# Patient Record
Sex: Female | Born: 1950 | Race: White | Hispanic: No | Smoking: Never smoker
Health system: Southern US, Community
[De-identification: ages and names within clinical notes are randomized; demographics above are authoritative.]

## PROBLEM LIST (undated history)

## (undated) DIAGNOSIS — J3081 Allergic rhinitis due to animal (cat) (dog) hair and dander: Secondary | ICD-10-CM

## (undated) DIAGNOSIS — T7840XA Allergy, unspecified, initial encounter: Secondary | ICD-10-CM

## (undated) DIAGNOSIS — L509 Urticaria, unspecified: Secondary | ICD-10-CM

## (undated) DIAGNOSIS — D229 Melanocytic nevi, unspecified: Secondary | ICD-10-CM

## (undated) DIAGNOSIS — I471 Supraventricular tachycardia, unspecified: Secondary | ICD-10-CM

## (undated) DIAGNOSIS — E7800 Pure hypercholesterolemia, unspecified: Secondary | ICD-10-CM

## (undated) DIAGNOSIS — M542 Cervicalgia: Secondary | ICD-10-CM

## (undated) DIAGNOSIS — R0789 Other chest pain: Secondary | ICD-10-CM

## (undated) DIAGNOSIS — E039 Hypothyroidism, unspecified: Secondary | ICD-10-CM

## (undated) HISTORY — PX: OTHER SURGICAL HISTORY: SHX169

## (undated) HISTORY — PX: ABDOMINAL HYSTERECTOMY: SHX81

## (undated) HISTORY — DX: Allergy, unspecified, initial encounter: T78.40XA

## (undated) HISTORY — PX: CARPAL TUNNEL RELEASE: SHX101

## (undated) HISTORY — DX: Melanocytic nevi, unspecified: D22.9

---

## 2005-06-01 ENCOUNTER — Ambulatory Visit: Payer: Self-pay

## 2006-02-09 ENCOUNTER — Ambulatory Visit: Payer: Self-pay | Admitting: Gastroenterology

## 2006-06-08 ENCOUNTER — Ambulatory Visit: Payer: Self-pay

## 2007-03-28 ENCOUNTER — Ambulatory Visit: Payer: Self-pay | Admitting: Urology

## 2007-05-09 ENCOUNTER — Ambulatory Visit: Payer: Self-pay | Admitting: Internal Medicine

## 2007-06-25 ENCOUNTER — Ambulatory Visit: Payer: Self-pay

## 2007-07-10 ENCOUNTER — Ambulatory Visit: Payer: Self-pay | Admitting: Urology

## 2008-07-06 ENCOUNTER — Ambulatory Visit: Payer: Self-pay

## 2009-07-27 ENCOUNTER — Ambulatory Visit: Payer: Self-pay

## 2010-07-28 ENCOUNTER — Ambulatory Visit: Payer: Self-pay

## 2011-07-31 ENCOUNTER — Ambulatory Visit: Payer: Self-pay

## 2012-06-13 ENCOUNTER — Ambulatory Visit: Payer: Self-pay | Admitting: General Practice

## 2013-09-28 ENCOUNTER — Emergency Department: Payer: Self-pay | Admitting: Internal Medicine

## 2016-04-07 ENCOUNTER — Encounter: Payer: Self-pay | Admitting: *Deleted

## 2016-04-10 ENCOUNTER — Encounter
Admission: RE | Disposition: A | Discharge status: Home / Self Care | Payer: Self-pay | Source: Ambulatory Visit | Attending: Gastroenterology

## 2016-04-10 ENCOUNTER — Ambulatory Visit: Payer: Medicare PPO | Admitting: Anesthesiology

## 2016-04-10 ENCOUNTER — Ambulatory Visit
Admission: RE | Admit: 2016-04-10 | Discharge: 2016-04-10 | Disposition: A | Discharge status: Home / Self Care | Payer: Medicare PPO | Source: Ambulatory Visit | Attending: Gastroenterology | Admitting: Gastroenterology

## 2016-04-10 ENCOUNTER — Encounter: Payer: Self-pay | Admitting: Gastroenterology

## 2016-04-10 DIAGNOSIS — E7800 Pure hypercholesterolemia, unspecified: Secondary | ICD-10-CM | POA: Diagnosis not present

## 2016-04-10 DIAGNOSIS — Z9889 Other specified postprocedural states: Secondary | ICD-10-CM | POA: Insufficient documentation

## 2016-04-10 DIAGNOSIS — K644 Residual hemorrhoidal skin tags: Secondary | ICD-10-CM | POA: Insufficient documentation

## 2016-04-10 DIAGNOSIS — K640 First degree hemorrhoids: Secondary | ICD-10-CM | POA: Insufficient documentation

## 2016-04-10 DIAGNOSIS — Z79899 Other long term (current) drug therapy: Secondary | ICD-10-CM | POA: Insufficient documentation

## 2016-04-10 DIAGNOSIS — E039 Hypothyroidism, unspecified: Secondary | ICD-10-CM | POA: Diagnosis not present

## 2016-04-10 DIAGNOSIS — Z7982 Long term (current) use of aspirin: Secondary | ICD-10-CM | POA: Diagnosis not present

## 2016-04-10 DIAGNOSIS — J3081 Allergic rhinitis due to animal (cat) (dog) hair and dander: Secondary | ICD-10-CM | POA: Insufficient documentation

## 2016-04-10 DIAGNOSIS — Z90710 Acquired absence of both cervix and uterus: Secondary | ICD-10-CM | POA: Insufficient documentation

## 2016-04-10 DIAGNOSIS — Z1211 Encounter for screening for malignant neoplasm of colon: Secondary | ICD-10-CM | POA: Diagnosis not present

## 2016-04-10 DIAGNOSIS — I471 Supraventricular tachycardia: Secondary | ICD-10-CM | POA: Insufficient documentation

## 2016-04-10 DIAGNOSIS — Z791 Long term (current) use of non-steroidal anti-inflammatories (NSAID): Secondary | ICD-10-CM | POA: Insufficient documentation

## 2016-04-10 HISTORY — PX: COLONOSCOPY WITH PROPOFOL: SHX5780

## 2016-04-10 HISTORY — DX: Urticaria, unspecified: L50.9

## 2016-04-10 HISTORY — DX: Allergic rhinitis due to animal (cat) (dog) hair and dander: J30.81

## 2016-04-10 HISTORY — DX: Supraventricular tachycardia, unspecified: I47.10

## 2016-04-10 HISTORY — DX: Supraventricular tachycardia: I47.1

## 2016-04-10 HISTORY — DX: Hypothyroidism, unspecified: E03.9

## 2016-04-10 HISTORY — DX: Other chest pain: R07.89

## 2016-04-10 HISTORY — DX: Cervicalgia: M54.2

## 2016-04-10 HISTORY — DX: Pure hypercholesterolemia, unspecified: E78.00

## 2016-04-10 SURGERY — COLONOSCOPY WITH PROPOFOL
Anesthesia: General

## 2016-04-10 MED ORDER — FENTANYL CITRATE (PF) 100 MCG/2ML IJ SOLN
INTRAMUSCULAR | Status: DC | PRN
  Administered 2016-04-10: 50 ug via INTRAVENOUS

## 2016-04-10 MED ORDER — SODIUM CHLORIDE 0.9 % IV SOLN
INTRAVENOUS | Status: DC
  Administered 2016-04-10 (×2): via INTRAVENOUS

## 2016-04-10 MED ORDER — MIDAZOLAM HCL 2 MG/2ML IJ SOLN
INTRAMUSCULAR | Status: DC | PRN
  Administered 2016-04-10: 1 mg via INTRAVENOUS

## 2016-04-10 MED ORDER — PROPOFOL 500 MG/50ML IV EMUL
INTRAVENOUS | Status: DC | PRN
  Administered 2016-04-10: 80 ug/kg/min via INTRAVENOUS

## 2016-04-10 MED ORDER — PROPOFOL 10 MG/ML IV BOLUS
INTRAVENOUS | Status: DC | PRN
  Administered 2016-04-10: 20 mg via INTRAVENOUS

## 2016-04-10 NOTE — Op Note (Signed)
[facility] Patient Name: Kayla Jensen Procedure Date: 04/10/2016 1:08 PM MRN: [id number] Account #: [id number] Date of Birth: 1951-07-12 Admit Type: Outpatient Age: 65 Room: [facility] ENDO ROOM 2 Gender: Female Note Status: Finalized Procedure:            Colonoscopy Indications:          Screening for colorectal malignant neoplasm, Last                        colonoscopy 10 years ago Patient Profile:      This is a 65 year old female. Providers:            Gerrit Heck. Rayann Heman, MD Referring MD:         Leonie Douglas. Doy Hutching, MD (Referring MD) Medicines:            Propofol per Anesthesia Complications:        No immediate complications. Procedure:            Pre-Anesthesia Assessment:                       - Prior to the procedure, a History and Physical was                        performed, and patient medications, allergies and                        sensitivities were reviewed. The patient's tolerance of                        previous anesthesia was reviewed.                       After obtaining informed consent, the colonoscope was                        passed under direct vision. Throughout the procedure,                        the patient's blood pressure, pulse, and oxygen                        saturations were monitored continuously. The                        Colonoscope was introduced through the anus and                        advanced to the the cecum, identified by appendiceal                        orifice and ileocecal valve. The colonoscopy was                        performed without difficulty. The patient tolerated the                        procedure well. The quality of the bowel preparation                        was excellent. Findings:      The perianal  exam findings include non-thrombosed external hemorrhoids.      Internal hemorrhoids were found during retroflexion. The hemorrhoids       were Grade I (internal hemorrhoids that do  not prolapse).      The exam was otherwise without abnormality. Impression:           - Non-thrombosed external hemorrhoids found on perianal                        exam.                       - Internal hemorrhoids.                       - The examination was otherwise normal.                       - No specimens collected. Recommendation:       - Observe patient in GI recovery unit.                       - High fiber diet.                       - Continue present medications.                       - Repeat colonoscopy in 10 years for screening purposes.                       - Return to referring physician.                       - Return to referring physician.                       - The findings and recommendations were discussed with                        the patient.                       - The findings and recommendations were discussed with                        the patient's family. Procedure Code(s):    --- Professional ---                       [phone], Colonoscopy, flexible; diagnostic, including                        collection of specimen(s) by brushing or washing, when                        performed (separate procedure) Diagnosis Code(s):    --- Professional ---                       Z12.11, Encounter for screening for malignant neoplasm                        of colon  K64.0, First degree hemorrhoids                       K64.4, Residual hemorrhoidal skin tags CPT copyright 2016 American Medical Association. All rights reserved. The codes documented in this report are preliminary and upon coder review may  be revised to meet current compliance requirements. Mellody Life, MD 04/10/2016 1:30:48 PM This report has been signed electronically. Number of Addenda: 0 Note Initiated On: 04/10/2016 1:08 PM Scope Withdrawal Time: 0 hours 12 minutes 16 seconds  Total Procedure Duration: 0 hours 17 minutes 28 seconds       [facility]

## 2016-04-10 NOTE — Anesthesia Preprocedure Evaluation (Signed)
Anesthesia Evaluation  Patient identified by MRN, date of birth, ID band Patient awake    Reviewed: Allergy & Precautions, H&P , NPO status , Patient's Chart, lab work & pertinent test results, reviewed documented beta blocker date and time   Airway Mallampati: II   Neck ROM: full    Dental  (+) Poor Dentition   Pulmonary neg pulmonary ROS,    Pulmonary exam normal        Cardiovascular negative cardio ROS Normal cardiovascular exam     Neuro/Psych negative neurological ROS  negative psych ROS   GI/Hepatic negative GI ROS, Neg liver ROS,   Endo/Other  negative endocrine ROSHypothyroidism   Renal/GU negative Renal ROS  negative genitourinary   Musculoskeletal   Abdominal   Peds  Hematology negative hematology ROS (+)   Anesthesia Other Findings Past Medical History:   Hypothyroidism                                               Cat allergies                                                Hives                                                        High cholesterol                                             Non-cardiac chest pain                                       PSVT (paroxysmal supraventricular tachycardia)*              Cervicalgia                                                Past Surgical History:   ABDOMINAL HYSTERECTOMY                                        Left foot surgery                                             CARPAL TUNNEL RELEASE                                         right hand ganglion removal  BMI    Body Mass Index   21.97 kg/m 2     Reproductive/Obstetrics                             Anesthesia Physical Anesthesia Plan  ASA: III  Anesthesia Plan: General   Post-op Pain Management:    Induction:   Airway Management Planned:   Additional Equipment:   Intra-op Plan:   Post-operative Plan:   Informed Consent: I  have reviewed the patients History and Physical, chart, labs and discussed the procedure including the risks, benefits and alternatives for the proposed anesthesia with the patient or authorized representative who has indicated his/her understanding and acceptance.   Dental Advisory Given  Plan Discussed with: CRNA  Anesthesia Plan Comments:         Anesthesia Quick Evaluation

## 2016-04-10 NOTE — Transfer of Care (Signed)
Immediate Anesthesia Transfer of Care Note  Patient: Kayla Jensen  Procedure(s) Performed: Procedure(s): COLONOSCOPY WITH PROPOFOL (N/A)  Patient Location: PACU  Anesthesia Type:General  Level of Consciousness: awake, alert  and oriented  Airway & Oxygen Therapy: Patient Spontanous Breathing and Patient connected to nasal cannula oxygen  Post-op Assessment: Report given to RN and Post -op Vital signs reviewed and stable  Post vital signs: Reviewed and stable  Last Vitals:  Filed Vitals:   04/10/16 1236  BP: 136/63  Pulse: 76  Temp: 36.7 C  Resp: 17    Complications: No apparent anesthesia complications

## 2016-04-10 NOTE — Discharge Instructions (Signed)

## 2016-04-10 NOTE — Anesthesia Postprocedure Evaluation (Signed)
Anesthesia Post Note  Patient: Kayla Jensen  Procedure(s) Performed: Procedure(s) (LRB): COLONOSCOPY WITH PROPOFOL (N/A)  Patient location during evaluation: PACU Anesthesia Type: General Level of consciousness: awake and alert Pain management: pain level controlled Vital Signs Assessment: post-procedure vital signs reviewed and stable Respiratory status: spontaneous breathing, nonlabored ventilation, respiratory function stable and patient connected to nasal cannula oxygen Cardiovascular status: blood pressure returned to baseline and stable Postop Assessment: no signs of nausea or vomiting Anesthetic complications: no    Last Vitals:  Filed Vitals:   04/10/16 1236 04/10/16 1332  BP: 136/63 106/56  Pulse: 76 104  Temp: 36.7 C 35.9 C  Resp: 17 19    Last Pain: There were no vitals filed for this visit.               Molli Barrows

## 2016-04-10 NOTE — H&P (Signed)
  Primary Care Physician:  Idelle Crouch, MD  Pre-Procedure History & Physical: HPI:  Kayla Jensen is a 65 y.o. female is here for an colonoscopy.   Past Medical History  Diagnosis Date  . Hypothyroidism   . Cat allergies   . Hives   . High cholesterol   . Non-cardiac chest pain   . PSVT (paroxysmal supraventricular tachycardia) ([place])   . Cervicalgia     Past Surgical History  Procedure Laterality Date  . Abdominal hysterectomy    . Left foot surgery    . Carpal tunnel release    . Right hand ganglion removal      Prior to Admission medications   Medication Sig Start Date End Date Taking? Authorizing Provider  aspirin EC 81 MG tablet Take 81 mg by mouth daily.   Yes Historical Provider, MD  Biotin 2500 MCG CAPS Take 2 capsules by mouth every morning.   Yes Historical Provider, MD  cetirizine (ZYRTEC) 10 MG tablet Take 10 mg by mouth at bedtime.   Yes Historical Provider, MD  estrogens, conjugated, (PREMARIN) 1.25 MG tablet Take 1.25 mg by mouth daily.   Yes Historical Provider, MD  ibuprofen (ADVIL,MOTRIN) 200 MG tablet Take 800 mg by mouth every 8 (eight) hours as needed.   Yes Historical Provider, MD  magnesium oxide (MAG-OX) 400 MG tablet Take 400 mg by mouth 2 (two) times daily.   Yes Historical Provider, MD  niacin (SLO-NIACIN) 500 MG tablet Take 500 mg by mouth at bedtime.   Yes Historical Provider, MD  vitamin E 400 UNIT capsule Take 400 Units by mouth daily.   Yes Historical Provider, MD    Allergies as of 04/04/2016  . (Not on File)    No family history on file.  Social History   Social History  . Marital Status: Married    Spouse Name: N/A  . Number of Children: N/A  . Years of Education: N/A   Occupational History  . Not on file.   Social History Main Topics  . Smoking status: Not on file  . Smokeless tobacco: Not on file  . Alcohol Use: Not on file  . Drug Use: Not on file  . Sexual Activity: Not on file   Other Topics Concern  . Not on file    Social History Narrative  . No narrative on file     Physical Exam: BP 136/63 mmHg  Pulse 76  Temp(Src) 98.1 F (36.7 C) (Oral)  Resp 17  Ht 5\' 3"  (1.6 m)  Wt 56.246 kg (124 lb)  BMI 21.97 kg/m2  SpO2 100% General:   Alert,  pleasant and cooperative in NAD Head:  Normocephalic and atraumatic. Neck:  Supple; no masses or thyromegaly. Lungs:  Clear throughout to auscultation.    Heart:  Regular rate and rhythm. Abdomen:  Soft, nontender and nondistended. Normal bowel sounds, without guarding, and without rebound.   Neurologic:  Alert and  oriented x4;  grossly normal neurologically.  Impression/Plan: Kayla Jensen is here for an colonoscopy to be performed for screening  Risks, benefits, limitations, and alternatives regarding  colonoscopy have been reviewed with the patient.  Questions have been answered.  All parties agreeable.   Josefine Class, MD  04/10/2016, 12:47 PM

## 2016-06-23 ENCOUNTER — Other Ambulatory Visit: Payer: Self-pay | Admitting: Internal Medicine

## 2016-06-23 DIAGNOSIS — Z1231 Encounter for screening mammogram for malignant neoplasm of breast: Secondary | ICD-10-CM

## 2016-06-28 ENCOUNTER — Other Ambulatory Visit: Payer: Self-pay | Admitting: *Deleted

## 2016-06-28 ENCOUNTER — Inpatient Hospital Stay
Admission: RE | Admit: 2016-06-28 | Discharge: 2016-06-28 | Disposition: A | Discharge status: Home / Self Care | Payer: Self-pay | Source: Ambulatory Visit | Attending: *Deleted | Admitting: *Deleted

## 2016-06-28 DIAGNOSIS — Z9289 Personal history of other medical treatment: Secondary | ICD-10-CM

## 2016-08-14 ENCOUNTER — Ambulatory Visit
Admission: RE | Admit: 2016-08-14 | Discharge: 2016-08-14 | Disposition: A | Discharge status: Home / Self Care | Payer: Medicare PPO | Source: Ambulatory Visit | Attending: Internal Medicine | Admitting: Internal Medicine

## 2016-08-14 ENCOUNTER — Other Ambulatory Visit: Payer: Self-pay | Admitting: Internal Medicine

## 2016-08-14 DIAGNOSIS — Z1231 Encounter for screening mammogram for malignant neoplasm of breast: Secondary | ICD-10-CM | POA: Diagnosis present

## 2017-07-09 ENCOUNTER — Other Ambulatory Visit: Payer: Self-pay | Admitting: Internal Medicine

## 2017-07-09 DIAGNOSIS — Z1231 Encounter for screening mammogram for malignant neoplasm of breast: Secondary | ICD-10-CM

## 2017-08-15 ENCOUNTER — Ambulatory Visit
Admission: RE | Admit: 2017-08-15 | Discharge: 2017-08-15 | Disposition: A | Discharge status: Home / Self Care | Payer: Medicare PPO | Source: Ambulatory Visit | Attending: Internal Medicine | Admitting: Internal Medicine

## 2017-08-15 DIAGNOSIS — Z1231 Encounter for screening mammogram for malignant neoplasm of breast: Secondary | ICD-10-CM | POA: Insufficient documentation

## 2017-09-03 ENCOUNTER — Ambulatory Visit (INDEPENDENT_AMBULATORY_CARE_PROVIDER_SITE_OTHER): Payer: Medicare PPO | Admitting: Obstetrics & Gynecology

## 2017-09-03 ENCOUNTER — Encounter: Payer: Self-pay | Admitting: Obstetrics & Gynecology

## 2017-09-03 VITALS — BP 100/60 | HR 86 | Ht 63.0 in | Wt 134.0 lb

## 2017-09-03 DIAGNOSIS — Z780 Asymptomatic menopausal state: Secondary | ICD-10-CM

## 2017-09-03 MED ORDER — ESTROGENS CONJUGATED 1.25 MG PO TABS: 1.2500 mg | ORAL_TABLET | Freq: Every day | ORAL | 3 refills | Status: DC

## 2017-09-03 NOTE — Patient Instructions (Signed)
Conjugated Estrogens tablets What is this medicine? CONJUGATED ESTROGENS (CON ju gate ed ESS troe jenz) is an estrogen. It is used as hormone replacement in menopausal women. It helps to treat hot flashes and prevent osteoporosis. It is also used to treat women with low hormone levels or in those who have had their ovaries removed. This medicine may be used for other purposes; ask your health care provider or pharmacist if you have questions. COMMON BRAND NAME(S): Premarin What should I tell my health care provider before I take this medicine? They need to know if you have any of these conditions: -abnormal vaginal bleeding -blood vessel disease or blood clots -breast, cervical, endometrial, ovarian, liver, or uterine cancer -dementia -diabetes -endometriosis -fibroids -gallbladder disease -heart disease or recent heart attack -high blood pressure -high cholesterol -high level of calcium in the blood -kidney disease -liver disease -mental depression -migraine headaches -protein C deficiency -protein S deficiency -stroke -tobacco smoker -an unusual or allergic reaction to estrogens, other medicines, foods, dyes, or preservatives -pregnant or trying to get pregnant -breast-feeding How should I use this medicine? Take this medicine by mouth with a glass of water. Follow the directions on the prescription label. Take your medicine at regular intervals, at the same time each day. Do not take your medicine more often than directed. A patient package insert for the product will be given with each prescription and refill. Read this sheet carefully each time. Talk to your pediatrician regarding the use of this medicine in children. This medicine is not approved for use in children. Overdosage: If you think you have taken too much of this medicine contact a poison control center or emergency room at once. NOTE: This medicine is only for you. Do not share this medicine with others. What if I  miss a dose? If you miss a dose, take it as soon as you can. If it is almost time for your next dose, take only that dose. Do not take double or extra doses. What may interact with this medicine? Do not take this medicine with any of the following medications: -aromatase inhibitors like aminoglutethimide, anastrozole, exemestane, letrozole, testolactone -metyrapone This medicine may also interact with the following medications: -barbiturates, such as phenobarbital -carbamazepine -clarithromycin -erythromycin -grapefruit juice -medicines for fungal infections like ketoconazole and itraconazole -phenytoin - rifampin -ritonavir -St. John's Wort -thyroid hormones This list may not describe all possible interactions. Give your health care provider a list of all the medicines, herbs, non-prescription drugs, or dietary supplements you use. Also tell them if you smoke, drink alcohol, or use illegal drugs. Some items may interact with your medicine. What should I watch for while using this medicine? Visit your health care professional for regular checks on your progress. You will need a regular breast and pelvic exam and Pap smear while on this medicine. You should also discuss the need for regular mammograms with your health care professional, and follow his or her guidelines for these tests. This medicine can make your body retain fluid, making your fingers, hands, or ankles swell. Your blood pressure can go up. Contact your doctor or health care professional if you feel you are retaining fluid. If you have any reason to think you are pregnant; stop taking this medicine at once and contact your doctor or health care professional. Smoking increases the risk of getting a blood clot or having a stroke while you are taking this medicine, especially if you are more than 66 years old. You are strongly advised  not to smoke. If you wear contact lenses and notice visual changes, or if the lenses begin to  feel uncomfortable, consult your eye care specialist. The tablet shell for some brands of this medicine does not dissolve. The tablet shell may appear whole in the stool. This is not a cause for concern. If you see something that resembles a tablet in your stool, talk to your healthcare provider. This medicine can increase the risk of developing a condition (endometrial hyperplasia) that may lead to cancer of the lining of the uterus. Taking progestins, another hormone drug, with this medicine lowers the risk of developing this condition. Therefore, if your uterus has not been removed (by a hysterectomy), your doctor may prescribe a progestin for you to take together with your estrogen. You should know, however, that taking estrogens with progestins may have additional health risks. You should discuss the use of estrogens and progestins with your health care professional to determine the benefits and risks for you. If you are going to have surgery, you may need to stop taking this medicine. Consult your health care professional for advice before you schedule the surgery. What side effects may I notice from receiving this medicine? Side effects that you should report to your doctor or health care professional as soon as possible: -allergic reactions like skin rash, itching or hives, swelling of the face, lips, or tongue -breast tissue changes or discharge -changes in vision -chest pain -confusion, trouble speaking or understanding -dark urine -general ill feeling or flu-like symptoms -light-colored stools -nausea, vomiting -pain, swelling, warmth in the leg -right upper belly pain -severe headaches -shortness of breath -sudden numbness or weakness of the face, arm or leg -trouble walking, dizziness, loss of balance or coordination -unusual vaginal bleeding -yellowing of the eyes or skin Side effects that usually do not require medical attention (report to your doctor or health care professional  if they continue or are bothersome): -hair loss -increased hunger or thirst -increased urination -symptoms of vaginal infection like itching, irritation or unusual discharge -unusually weak or tired This list may not describe all possible side effects. Call your doctor for medical advice about side effects. You may report side effects to FDA at 1-800-FDA-1088. Where should I keep my medicine? Keep out of the reach of children. Store at room temperature between 15 and 30 degrees C (59 and 86 degrees F). Throw away any unused medicine after the expiration date. NOTE: This sheet is a summary. It may not cover all possible information. If you have questions about this medicine, talk to your doctor, pharmacist, or health care provider.  2018 Elsevier/Gold Standard (2011-03-15 09:20:56)

## 2017-09-03 NOTE — Addendum Note (Signed)
Addended by: Gae Dry on: 09/03/2017 01:52 PM   Modules accepted: Orders

## 2017-09-03 NOTE — Progress Notes (Signed)
History of Present Illness:  Kayla Jensen is a 66 y.o. who was started on Premarin years ago but started on a taper down in dose last year from 1.25 mg to 0.9 mg daily therapy; Since that time, she states that her symptoms are worsening.  She went back to her 1.25 mg daily dose. Denies bladder or vaginal sx's. Prior TAH BSO  PMHx: She  has a past medical history of Cat allergies; Cervicalgia; High cholesterol; Hives; Hypothyroidism; Non-cardiac chest pain; and PSVT (paroxysmal supraventricular tachycardia) ([place]). Also,  has a past surgical history that includes Abdominal hysterectomy; Left foot surgery; Carpal tunnel release; right hand ganglion removal; and Colonoscopy with propofol (N/A, 04/10/2016)., family history includes Breast cancer in her cousin and maternal aunt.,  reports that she has never smoked. She has never used smokeless tobacco. She reports that she does not drink alcohol or use drugs.  Current Outpatient Prescriptions:  .  aspirin EC 81 MG tablet, Take 81 mg by mouth daily., Disp: , Rfl:  .  Biotin 2500 MCG CAPS, Take 2 capsules by mouth every morning., Disp: , Rfl:  .  cetirizine (ZYRTEC) 10 MG tablet, Take 10 mg by mouth at bedtime., Disp: , Rfl:  .  estrogens, conjugated, (PREMARIN) 1.25 MG tablet, Take 1.25 mg by mouth daily., Disp: , Rfl:  .  ibuprofen (ADVIL,MOTRIN) 200 MG tablet, Take 800 mg by mouth every 8 (eight) hours as needed., Disp: , Rfl:  .  magnesium oxide (MAG-OX) 400 MG tablet, Take 400 mg by mouth 2 (two) times daily., Disp: , Rfl:  .  niacin (SLO-NIACIN) 500 MG tablet, Take 500 mg by mouth at bedtime., Disp: , Rfl:  .  vitamin E 400 UNIT capsule, Take 400 Units by mouth daily., Disp: , Rfl:   Also, is allergic to fosteum [genistein-zn chelate-vit d] and hydrocodone-homatropine..  Review of Systems  Constitutional: Negative for chills, fever and malaise/fatigue.  HENT: Negative for congestion, sinus pain and sore throat.   Eyes: Negative for blurred  vision and pain.  Respiratory: Negative for cough and wheezing.   Cardiovascular: Negative for chest pain and leg swelling.  Gastrointestinal: Negative for abdominal pain, constipation, diarrhea, heartburn, nausea and vomiting.  Genitourinary: Negative for dysuria, frequency, hematuria and urgency.  Musculoskeletal: Negative for back pain, joint pain, myalgias and neck pain.  Skin: Negative for itching and rash.  Neurological: Negative for dizziness, tremors and weakness.  Endo/Heme/Allergies: Does not bruise/bleed easily.  Psychiatric/Behavioral: Negative for depression. The patient is not nervous/anxious and does not have insomnia.     Physical Exam:  BP 100/60   Pulse 86   Ht 5\' 3"  (1.6 m)   Wt 134 lb (60.8 kg)   BMI 23.74 kg/m  Body mass index is 23.74 kg/m. Physical examination Constitutional NAD, Conversant  HEENT Atraumatic; Op clear with mmm.  Normo-cephalic. Pupils reactive. Anicteric sclerae  Thyroid/Neck Smooth without nodularity or enlargement. Normal ROM.  Neck Supple.  Skin No rashes, lesions or ulceration. Normal palpated skin turgor. No nodularity.  Breasts: No masses or discharge.  Symmetric.  No axillary adenopathy.  Lungs: Clear to auscultation.No rales or wheezes. Normal Respiratory effort, no retractions.  Heart: NSR.  No murmurs or rubs appreciated. No periferal edema  Abdomen: Soft.  Non-tender.  No masses.  No HSM. No hernia  Extremities: Moves all appropriately.  Normal ROM for age. No lymphadenopathy.  Neuro: Grossly intact  Psych: Oriented to PPT.  Normal mood. Normal affect.     Pelvic:  Vulva: Normal appearance.  No lesions.  Vagina: No lesions or abnormalities noted.  Support: Normal pelvic support.  Urethra No masses tenderness or scarring.  Meatus Normal size without lesions or prolapse.  Cervix Surgically absent  Vag Cuff: Intact.  No lesions.  Anus: Normal exam.  No lesions.  Perineum: Normal exam.  No lesions.        Bimanual   Uterus:  Surgically Absent  Adnexae: No masses.  Non-tender to palpation.  Cuff: Negative for abnormality.   Assessment:  Problem List Items Addressed This Visit      Other   Menopause - Primary     Medication treatment is going well for her menopause sx's, and did not tolerate taper well.   Plan: She will undergo no change in her medical therapy.  Offered taper vs alternative non-hormonal therapies, she prefers current therapy.  She was amenable to this plan and we will see her back for annual/PRN. Needs PAP next year. Colonoscopy 2016, due in 8 years.  Stool testing at PCP. MMG UTD.  Barnett Applebaum, MD, [facility], [facility] 09/03/2017  1:35 PM

## 2018-07-08 ENCOUNTER — Other Ambulatory Visit: Payer: Self-pay | Admitting: Internal Medicine

## 2018-07-08 DIAGNOSIS — Z1231 Encounter for screening mammogram for malignant neoplasm of breast: Secondary | ICD-10-CM

## 2018-08-20 ENCOUNTER — Ambulatory Visit
Admission: RE | Admit: 2018-08-20 | Discharge: 2018-08-20 | Disposition: A | Discharge status: Home / Self Care | Payer: Medicare PPO | Source: Ambulatory Visit | Attending: Internal Medicine | Admitting: Internal Medicine

## 2018-08-20 DIAGNOSIS — Z1231 Encounter for screening mammogram for malignant neoplasm of breast: Secondary | ICD-10-CM | POA: Insufficient documentation

## 2018-09-09 ENCOUNTER — Encounter: Payer: Self-pay | Admitting: Obstetrics & Gynecology

## 2018-09-09 ENCOUNTER — Other Ambulatory Visit (HOSPITAL_COMMUNITY)
Admission: RE | Admit: 2018-09-09 | Discharge: 2018-09-09 | Disposition: A | Discharge status: Home / Self Care | Payer: Medicare PPO | Source: Ambulatory Visit | Attending: Obstetrics & Gynecology | Admitting: Obstetrics & Gynecology

## 2018-09-09 ENCOUNTER — Ambulatory Visit (INDEPENDENT_AMBULATORY_CARE_PROVIDER_SITE_OTHER): Payer: Medicare PPO | Admitting: Obstetrics & Gynecology

## 2018-09-09 VITALS — BP 130/80 | Ht 63.0 in | Wt 134.0 lb

## 2018-09-09 DIAGNOSIS — Z124 Encounter for screening for malignant neoplasm of cervix: Secondary | ICD-10-CM | POA: Insufficient documentation

## 2018-09-09 DIAGNOSIS — Z01419 Encounter for gynecological examination (general) (routine) without abnormal findings: Secondary | ICD-10-CM | POA: Diagnosis not present

## 2018-09-09 DIAGNOSIS — Z0000 Encounter for general adult medical examination without abnormal findings: Secondary | ICD-10-CM

## 2018-09-09 DIAGNOSIS — Z1272 Encounter for screening for malignant neoplasm of vagina: Secondary | ICD-10-CM | POA: Diagnosis not present

## 2018-09-09 DIAGNOSIS — Z780 Asymptomatic menopausal state: Secondary | ICD-10-CM

## 2018-09-09 MED ORDER — ESTROGENS CONJUGATED 1.25 MG PO TABS: 1.2500 mg | ORAL_TABLET | Freq: Every day | ORAL | 3 refills | Status: DC

## 2018-09-09 NOTE — Progress Notes (Signed)
HPI:      [name] is a 67 y.o. G1P1001 who LMP was in the past, she presents today for her annual examination.  The patient has no complaints today.  Herlast pap: approximate date 2014 and was normal and last mammogram: approximate date 2019 (august) and was normal.  The patient does perform self breast exams.  There is no notable family history of breast or ovarian cancer in her family. The patient is taking hormone replacement therapy. Patient denies post-menopausal vaginal bleeding.   The patient has regular exercise: yes. The patient denies current symptoms of depression.    GYN Hx: Last Colonoscopy:3 years ago. Normal.  Last DEXA: never ago.    PMHx: Past Medical History:  Diagnosis Date  . Cat allergies   . Cervicalgia   . High cholesterol   . Hives   . Hypothyroidism   . Non-cardiac chest pain   . PSVT (paroxysmal supraventricular tachycardia) ([place])    Past Surgical History:  Procedure Laterality Date  . ABDOMINAL HYSTERECTOMY    . CARPAL TUNNEL RELEASE    . COLONOSCOPY WITH PROPOFOL N/A 04/10/2016   Procedure: COLONOSCOPY WITH PROPOFOL;  Surgeon: Josefine Class, MD;  Location: [facility] ENDOSCOPY;  Service: Endoscopy;  Laterality: N/A;  . Left foot surgery    . right hand ganglion removal     Family History  Problem Relation Age of Onset  . Breast cancer Maternal Aunt   . Breast cancer Cousin    Social History   Tobacco Use  . Smoking status: Never Smoker  . Smokeless tobacco: Never Used  Substance Use Topics  . Alcohol use: No  . Drug use: No    Current Outpatient Medications:  .  Biotin 2500 MCG CAPS, Take 2 capsules by mouth every morning., Disp: , Rfl:  .  cetirizine (ZYRTEC) 10 MG tablet, Take 10 mg by mouth at bedtime., Disp: , Rfl:  .  estrogens, conjugated, (PREMARIN) 1.25 MG tablet, Take 1 tablet (1.25 mg total) by mouth daily., Disp: 90 tablet, Rfl: 3 .  ibuprofen (ADVIL,MOTRIN) 200 MG tablet, Take 800 mg by mouth every 8 (eight) hours as  needed., Disp: , Rfl:  .  magnesium oxide (MAG-OX) 400 MG tablet, Take 400 mg by mouth 2 (two) times daily., Disp: , Rfl:  .  niacin (SLO-NIACIN) 500 MG tablet, Take 500 mg by mouth at bedtime., Disp: , Rfl:  .  vitamin E 400 UNIT capsule, Take 400 Units by mouth daily., Disp: , Rfl:  .  aspirin EC 81 MG tablet, Take 81 mg by mouth daily., Disp: , Rfl:  Allergies: Fosteum [genistein-zn chelate-vit d] and Hydrocodone-homatropine  Review of Systems  Constitutional: Negative for chills, fever and malaise/fatigue.  HENT: Negative for congestion, sinus pain and sore throat.   Eyes: Negative for blurred vision and pain.  Respiratory: Negative for cough and wheezing.   Cardiovascular: Negative for chest pain and leg swelling.  Gastrointestinal: Negative for abdominal pain, constipation, diarrhea, heartburn, nausea and vomiting.  Genitourinary: Negative for dysuria, frequency, hematuria and urgency.  Musculoskeletal: Negative for back pain, joint pain, myalgias and neck pain.  Skin: Negative for itching and rash.  Neurological: Negative for dizziness, tremors and weakness.  Endo/Heme/Allergies: Does not bruise/bleed easily.  Psychiatric/Behavioral: Negative for depression. The patient is not nervous/anxious and does not have insomnia.    Objective: BP 130/80   Ht 5\' 3"  (1.6 m)   Wt 134 lb (60.8 kg)   BMI 23.74 kg/m   [employer]  Weights   09/09/18 1324  Weight: 134 lb (60.8 kg)   Body mass index is 23.74 kg/m. Physical Exam  Constitutional: She is oriented to person, place, and time. She appears well-developed and well-nourished. No distress.  Genitourinary: Rectum normal and vagina normal. Pelvic exam was performed with patient supine. There is no rash or lesion on the right labia. There is no rash or lesion on the left labia. Vagina exhibits no lesion. No bleeding in the vagina. Right adnexum does not display mass and does not display tenderness. Left adnexum does not display mass and does not  display tenderness.  Genitourinary Comments: Absent Uterus Absent cervix Vaginal cuff well healed  HENT:  Head: Normocephalic and atraumatic. Head is without laceration.  Right Ear: Hearing normal.  Left Ear: Hearing normal.  Nose: No epistaxis.  No foreign bodies.  Mouth/Throat: Uvula is midline, oropharynx is clear and moist and mucous membranes are normal.  Eyes: Pupils are equal, round, and reactive to light.  Neck: Normal range of motion. Neck supple. No thyromegaly present.  Cardiovascular: Normal rate and regular rhythm. Exam reveals no gallop and no friction rub.  No murmur heard. Pulmonary/Chest: Effort normal and breath sounds normal. No respiratory distress. She has no wheezes. Right breast exhibits no mass, no skin change and no tenderness. Left breast exhibits no mass, no skin change and no tenderness.  Abdominal: Soft. Bowel sounds are normal. She exhibits no distension. There is no tenderness. There is no rebound.  Musculoskeletal: Normal range of motion.  Neurological: She is alert and oriented to person, place, and time. No cranial nerve deficit.  Skin: Skin is warm and dry.  Psychiatric: She has a normal mood and affect. Judgment normal.  Vitals reviewed.  Assessment: Annual Exam 1. Screening for vaginal cancer   2. Annual physical exam   3. Menopause    Plan:            1.  Vaginal Screening-  Pap smear done today  2. Breast screening- Exam annually and mammogram scheduled  3. Colonoscopy every 10 years, Hemoccult testing after age 56 done at PCP office  4. Labs managed by PCP  5. Counseling for hormonal therapy: no change in therapy today.  Prefers current dose as last time we tried taper she had mood and hot flash sx's.  6. Occas GSI only with has illness w cough/sneeze.  7. Flu shot UTD    F/U  Return in about 1 year (around 09/10/2019) for Annual.  Barnett Applebaum, MD, [facility], [facility] 09/09/2018  1:45 PM

## 2018-09-09 NOTE — Patient Instructions (Signed)
PAP every 5 years Mammogram every year    Call [phone] to schedule at [facility] Colonoscopy every 10 years Labs yearly (with PCP) 

## 2018-09-10 LAB — CYTOLOGY - PAP: DIAGNOSIS: NEGATIVE

## 2019-05-26 ENCOUNTER — Other Ambulatory Visit: Payer: Self-pay | Admitting: Internal Medicine

## 2019-05-26 DIAGNOSIS — Z1231 Encounter for screening mammogram for malignant neoplasm of breast: Secondary | ICD-10-CM

## 2019-08-25 ENCOUNTER — Ambulatory Visit
Admission: RE | Admit: 2019-08-25 | Discharge: 2019-08-25 | Disposition: A | Discharge status: Home / Self Care | Payer: Medicare PPO | Source: Ambulatory Visit | Attending: Internal Medicine | Admitting: Internal Medicine

## 2019-08-25 DIAGNOSIS — Z1231 Encounter for screening mammogram for malignant neoplasm of breast: Secondary | ICD-10-CM | POA: Diagnosis present

## 2019-09-11 ENCOUNTER — Ambulatory Visit (INDEPENDENT_AMBULATORY_CARE_PROVIDER_SITE_OTHER): Payer: Medicare PPO | Admitting: Obstetrics & Gynecology

## 2019-09-11 ENCOUNTER — Other Ambulatory Visit: Payer: Self-pay

## 2019-09-11 ENCOUNTER — Encounter: Payer: Self-pay | Admitting: Obstetrics & Gynecology

## 2019-09-11 VITALS — BP 140/80 | Ht 63.0 in | Wt 135.0 lb

## 2019-09-11 DIAGNOSIS — Z780 Asymptomatic menopausal state: Secondary | ICD-10-CM

## 2019-09-11 DIAGNOSIS — Z1239 Encounter for other screening for malignant neoplasm of breast: Secondary | ICD-10-CM

## 2019-09-11 DIAGNOSIS — Z01419 Encounter for gynecological examination (general) (routine) without abnormal findings: Secondary | ICD-10-CM

## 2019-09-11 MED ORDER — ESTROGENS CONJUGATED 1.25 MG PO TABS: 1.2500 mg | ORAL_TABLET | Freq: Every day | ORAL | 3 refills | Status: DC

## 2019-09-11 NOTE — Progress Notes (Signed)
HPI:      Kayla Jensen is a 68 y.o. G1P1001 who LMP was in the past, she presents today for her annual examination.  The patient has no complaints today. The patient is not sexually active. Herlast pap: approximate date 2019 and was normal and last mammogram: approximate date 2020 and was normal.  The patient does perform self breast exams.  There is notable family history of breast or ovarian cancer in her family. The patient is taking hormone replacement therapy and desires to continue for menopausal sx's and how it helps her feel and to prevent osteoporosis. Patient denies post-menopausal vaginal bleeding.   The patient has regular exercise: yes. The patient denies current symptoms of depression.    GYN Hx: Last Colonoscopy:4 years ago. Normal.     PMHx: Past Medical History:  Diagnosis Date  . Cat allergies   . Cervicalgia   . High cholesterol   . Hives   . Hypothyroidism   . Non-cardiac chest pain   . PSVT (paroxysmal supraventricular tachycardia) ([place])    Past Surgical History:  Procedure Laterality Date  . ABDOMINAL HYSTERECTOMY    . CARPAL TUNNEL RELEASE    . COLONOSCOPY WITH PROPOFOL N/A 04/10/2016   Procedure: COLONOSCOPY WITH PROPOFOL;  Surgeon: Josefine Class, MD;  Location: [facility] ENDOSCOPY;  Service: Endoscopy;  Laterality: N/A;  . Left foot surgery    . right hand ganglion removal     Family History  Problem Relation Age of Onset  . Breast cancer Maternal Aunt   . Breast cancer Cousin    Social History   Tobacco Use  . Smoking status: Never Smoker  . Smokeless tobacco: Never Used  Substance Use Topics  . Alcohol use: No  . Drug use: No    Current Outpatient Medications:  .  aspirin EC 81 MG tablet, Take 81 mg by mouth daily., Disp: , Rfl:  .  Biotin 2500 MCG CAPS, Take 2 capsules by mouth every morning., Disp: , Rfl:  .  cetirizine (ZYRTEC) 10 MG tablet, Take 10 mg by mouth at bedtime., Disp: , Rfl:  .  doxycycline (PERIOSTAT) 20 MG tablet, , Disp:  , Rfl:  .  estrogens, conjugated, (PREMARIN) 1.25 MG tablet, Take 1 tablet (1.25 mg total) by mouth daily., Disp: 90 tablet, Rfl: 3 .  ibuprofen (ADVIL,MOTRIN) 200 MG tablet, Take 800 mg by mouth every 8 (eight) hours as needed., Disp: , Rfl:  .  magnesium oxide (MAG-OX) 400 MG tablet, Take 400 mg by mouth 2 (two) times daily., Disp: , Rfl:  .  metroNIDAZOLE (METROGEL) 1 % gel, APPLY TOPICALLY TO FACE QHS FOR ROSACEA, Disp: , Rfl:  .  niacin (SLO-NIACIN) 500 MG tablet, Take 500 mg by mouth at bedtime., Disp: , Rfl:  .  vitamin E 400 UNIT capsule, Take 400 Units by mouth daily., Disp: , Rfl:  Allergies: Fosteum [genistein-zn chelate-vit d] and Hydrocodone-homatropine  Review of Systems  Constitutional: Negative for chills, fever and malaise/fatigue.  HENT: Negative for congestion, sinus pain and sore throat.   Eyes: Positive for blurred vision. Negative for pain.  Respiratory: Negative for cough and wheezing.   Cardiovascular: Negative for chest pain and leg swelling.  Gastrointestinal: Negative for abdominal pain, constipation, diarrhea, heartburn, nausea and vomiting.  Genitourinary: Negative for dysuria, frequency, hematuria and urgency.  Musculoskeletal: Negative for back pain, joint pain, myalgias and neck pain.  Skin: Positive for rash. Negative for itching.  Neurological: Negative for dizziness, tremors and weakness.  Endo/Heme/Allergies: Does not bruise/bleed easily.  Psychiatric/Behavioral: Negative for depression. The patient is not nervous/anxious and does not have insomnia.     Objective: BP 140/80   Ht 5\' 3"  (1.6 m)   Wt 135 lb (61.2 kg)   BMI 23.91 kg/m   [employer] Weights   09/11/19 1321  Weight: 135 lb (61.2 kg)   Body mass index is 23.91 kg/m. Physical Exam Constitutional:      General: She is not in acute distress.    Appearance: She is well-developed.  Genitourinary:     Pelvic exam was performed with patient supine.     Vagina and rectum normal.     No lesions  in the vagina.     No vaginal bleeding.     No right or left adnexal mass present.     Right adnexa not tender.     Left adnexa not tender.     Genitourinary Comments: Absent Uterus Absent cervix Vaginal cuff well healed  HENT:     Head: Normocephalic and atraumatic. No laceration.     Right Ear: Hearing normal.     Left Ear: Hearing normal.     Mouth/Throat:     Pharynx: Uvula midline.  Eyes:     Pupils: Pupils are equal, round, and reactive to light.  Neck:     Musculoskeletal: Normal range of motion and neck supple.     Thyroid: No thyromegaly.  Cardiovascular:     Rate and Rhythm: Normal rate and regular rhythm.     Heart sounds: No murmur. No friction rub. No gallop.   Pulmonary:     Effort: Pulmonary effort is normal. No respiratory distress.     Breath sounds: Normal breath sounds. No wheezing.  Chest:     Breasts:        Right: No mass, skin change or tenderness.        Left: No mass, skin change or tenderness.  Abdominal:     General: Bowel sounds are normal. There is no distension.     Palpations: Abdomen is soft.     Tenderness: There is no abdominal tenderness. There is no rebound.  Musculoskeletal: Normal range of motion.  Neurological:     Mental Status: She is alert and oriented to person, place, and time.     Cranial Nerves: No cranial nerve deficit.  Skin:    General: Skin is warm and dry.  Psychiatric:        Judgment: Judgment normal.  Vitals signs reviewed.     Assessment: Annual Exam 1. Women's annual routine gynecological examination   2. Screening for breast cancer   3. Menopause     Plan:            1.  Cervical Screening-  Pap smear schedule reviewed with patient, prior hyst, PAP last year normal  2. Breast screening- Exam annually and mammogram scheduled  3. Colonoscopy every 10 years, Hemoccult testing after age 88  4. Labs managed by PCP  5. Counseling for hormonal therapy: no change in therapy today Refill Premarin.  Pros and  cons of ERT discussed.              6. FRAX - FRAX score for assessing the 10 year probability for fracture calculated and discussed today.  Based on age and score today, DEXA is not currently scheduled.    F/U  Return in about 1 year (around 09/10/2020) for Annual.  Barnett Applebaum, MD, [facility], [facility] 09/11/2019  1:52 PM

## 2020-02-09 ENCOUNTER — Other Ambulatory Visit: Payer: Self-pay

## 2020-02-09 ENCOUNTER — Ambulatory Visit: Payer: Medicare HMO | Attending: Internal Medicine

## 2020-02-09 DIAGNOSIS — Z23 Encounter for immunization: Secondary | ICD-10-CM | POA: Insufficient documentation

## 2020-02-09 NOTE — Progress Notes (Signed)
   Covid-19 Vaccination Clinic  Name:  Kayla Jensen    MRN: [id number] DOB: Aug 24, 1951  02/09/2020  [name] was observed post Covid-19 immunization for 15 minutes without incidence. She was provided with Vaccine Information Sheet and instruction to access the V-Safe system.   [name] was instructed to call 911 with any severe reactions post vaccine: Marland Kitchen Difficulty breathing  . Swelling of your face and throat  . A fast heartbeat  . A bad rash all over your body  . Dizziness and weakness    Immunizations Administered    Name Date Dose VIS Date Route   Pfizer COVID-19 Vaccine 02/09/2020 10:41 AM 0.3 mL 12/05/2019 Intramuscular   Manufacturer: [place]   Lot: [id number]   [place]: [id number]

## 2020-03-09 ENCOUNTER — Ambulatory Visit: Payer: Medicare HMO | Attending: Internal Medicine

## 2020-03-09 DIAGNOSIS — Z23 Encounter for immunization: Secondary | ICD-10-CM

## 2020-03-09 NOTE — Progress Notes (Signed)
   Covid-19 Vaccination Clinic  Name:  Kayla Jensen    MRN: [id number] DOB: 1951-11-15  03/09/2020  [name] was observed post Covid-19 immunization for 15 minutes without incident. She was provided with Vaccine Information Sheet and instruction to access the V-Safe system.   [name] was instructed to call 911 with any severe reactions post vaccine: Marland Kitchen Difficulty breathing  . Swelling of face and throat  . A fast heartbeat  . A bad rash all over body  . Dizziness and weakness   Immunizations Administered    Name Date Dose VIS Date Route   Pfizer COVID-19 Vaccine 03/09/2020 11:33 AM 0.3 mL 12/05/2019 Intramuscular   Manufacturer: [place]   Lot: [id number]   [place]: [id number]

## 2020-05-05 ENCOUNTER — Other Ambulatory Visit: Payer: Self-pay

## 2020-05-05 ENCOUNTER — Ambulatory Visit (INDEPENDENT_AMBULATORY_CARE_PROVIDER_SITE_OTHER): Payer: Medicare HMO | Admitting: Dermatology

## 2020-05-05 ENCOUNTER — Encounter: Payer: Self-pay | Admitting: Dermatology

## 2020-05-05 DIAGNOSIS — L719 Rosacea, unspecified: Secondary | ICD-10-CM

## 2020-05-05 DIAGNOSIS — Z86018 Personal history of other benign neoplasm: Secondary | ICD-10-CM | POA: Diagnosis not present

## 2020-05-05 DIAGNOSIS — D229 Melanocytic nevi, unspecified: Secondary | ICD-10-CM

## 2020-05-05 DIAGNOSIS — Z1283 Encounter for screening for malignant neoplasm of skin: Secondary | ICD-10-CM | POA: Diagnosis not present

## 2020-05-05 DIAGNOSIS — D485 Neoplasm of uncertain behavior of skin: Secondary | ICD-10-CM | POA: Diagnosis not present

## 2020-05-05 DIAGNOSIS — L578 Other skin changes due to chronic exposure to nonionizing radiation: Secondary | ICD-10-CM

## 2020-05-05 DIAGNOSIS — L814 Other melanin hyperpigmentation: Secondary | ICD-10-CM

## 2020-05-05 DIAGNOSIS — D1801 Hemangioma of skin and subcutaneous tissue: Secondary | ICD-10-CM

## 2020-05-05 DIAGNOSIS — L821 Other seborrheic keratosis: Secondary | ICD-10-CM

## 2020-05-05 MED ORDER — DOXYCYCLINE HYCLATE 20 MG PO TABS: ORAL_TABLET | ORAL | 11 refills | Status: DC

## 2020-05-05 MED ORDER — METRONIDAZOLE 1 % EX GEL: CUTANEOUS | 11 refills | Status: DC

## 2020-05-05 NOTE — Progress Notes (Signed)
   Follow-Up Visit   Subjective  Kayla Jensen is a 69 y.o. female who presents for the following: Annual Exam (history of mole removal on the R forehead, patient thinks it was abnormal (Dr. Sharlett Iles about 45 years ago)). The patient presents for total body skin examination for skin cancer screening and mole check.  The following portions of the chart were reviewed this encounter and updated as appropriate:  Tobacco  Allergies  Meds  Problems  Med Hx  Surg Hx  Fam Hx     Review of Systems:  No other skin or systemic complaints except as noted in HPI or Assessment and Plan.  Objective  Well appearing patient in no apparent distress; mood and affect are within normal limits.  A full examination was performed including scalp, head, eyes, ears, nose, lips, neck, chest, axillae, abdomen, back, buttocks, bilateral upper extremities, bilateral lower extremities, hands, feet, fingers, toes, fingernails, and toenails. All findings within normal limits unless otherwise noted below.  Objective  Face: Pinkness on the face, no active papules.   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 Sharlett Iles many years ago - unsure of pathology   Rosacea Face exacerbated by mask wearing -  Continue Doxycycline 20mg  po BID and Metronidazole gel QHS  doxycycline (PERIOSTAT) 20 MG tablet - Face  metroNIDAZOLE (METROGEL) 1 % gel - Face   Lentigines - Scattered tan macules - Discussed due to sun exposure - Benign, observe - Call for any changes  Seborrheic Keratoses - Stuck-on, waxy, tan-brown papules and plaques  - Discussed benign etiology and prognosis. - Observe - Call for any changes  Melanocytic Nevi - Tan-brown and/or pink-flesh-colored symmetric macules and papules - Benign appearing on exam today - Observation - Call clinic for new or changing moles - Recommend daily use of broad spectrum spf 30+  sunscreen to sun-exposed areas.   Hemangiomas - Red papules - Discussed benign nature - Observe - Call for any changes  Actinic Damage - diffuse scaly erythematous macules with underlying dyspigmentation - Recommend daily broad spectrum sunscreen SPF 30+ to sun-exposed areas, reapply every 2 hours as needed.  - Call for new or changing lesions.  Skin cancer screening performed today.  Return in about 1 year (around 05/05/2021) for TBSE.  Luther Redo, CMA, am acting as scribe for Sarina Ser, MD . Documentation: I have reviewed the above documentation for accuracy and completeness, and I agree with the above.  Sarina Ser, MD

## 2020-05-12 ENCOUNTER — Encounter: Payer: Self-pay | Admitting: Dermatology

## 2020-06-14 ENCOUNTER — Ambulatory Visit (INDEPENDENT_AMBULATORY_CARE_PROVIDER_SITE_OTHER): Payer: Medicare HMO | Admitting: Dermatology

## 2020-06-14 ENCOUNTER — Other Ambulatory Visit: Payer: Self-pay

## 2020-06-14 DIAGNOSIS — L719 Rosacea, unspecified: Secondary | ICD-10-CM | POA: Diagnosis not present

## 2020-06-14 DIAGNOSIS — L720 Epidermal cyst: Secondary | ICD-10-CM | POA: Diagnosis not present

## 2020-06-14 DIAGNOSIS — I8310 Varicose veins of unspecified lower extremity with inflammation: Secondary | ICD-10-CM

## 2020-06-14 NOTE — Progress Notes (Signed)
   Follow-Up Visit   Subjective  Kayla Jensen is a 69 y.o. female who presents for the following: milium (of the face - patient would like them removed today ) and veins (of the B/L leg - patient has noticed new veins appearing and would like them checked).  The following portions of the chart were reviewed this encounter and updated as appropriate:  Tobacco  Allergies  Meds  Problems  Med Hx  Surg Hx  Fam Hx     Review of Systems:  No other skin or systemic complaints except as noted in HPI or Assessment and Plan.  Objective  Well appearing patient in no apparent distress; mood and affect are within normal limits.  A focused examination was performed including the face and legs. Relevant physical exam findings are noted in the Assessment and Plan.  Objective  Face x 3 (3): Smooth white papule(s).   Objective  Face: Erythema   Assessment & Plan    Milia (3) Face x 3  Acne/Milia surgery - Face x 3 Procedure risks and benefits were discussed with the patient and verbal consent was obtained. Following prep of the skin on the face with an alcohol swab, extraction of milia was performed with a comedone extractor following superficial incision made over their surfaces with a #11 surgical blade. Capillary hemostasis was achieved with 20% aluminum chloride solution. Vaseline ointment was applied to each site. The patient tolerated the procedure well.  Rosacea Face  Continue Metronidazole 1% gel QHS and Doxycycline 20mg  po QD.   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  Luther Redo, CMA, am acting as  scribe for Sarina Ser, MD .  Documentation: I have reviewed the above documentation for accuracy and completeness, and I agree with the above.  Sarina Ser, MD

## 2020-06-15 ENCOUNTER — Encounter: Payer: Self-pay | Admitting: Dermatology

## 2020-07-13 ENCOUNTER — Encounter: Payer: Medicare HMO | Admitting: Dermatology

## 2020-07-15 ENCOUNTER — Other Ambulatory Visit: Payer: Self-pay | Admitting: Internal Medicine

## 2020-07-15 DIAGNOSIS — Z1231 Encounter for screening mammogram for malignant neoplasm of breast: Secondary | ICD-10-CM

## 2020-07-21 IMAGING — MG MM DIGITAL SCREENING BILAT W/ TOMO W/ CAD
8 series · 9 of 24 positions shown · non-contrast
Comparison: Previous exam(s).

CLINICAL DATA: Screening.

EXAM:
DIGITAL SCREENING BILATERAL MAMMOGRAM WITH TOMO AND CAD

[R CC synth-2D]
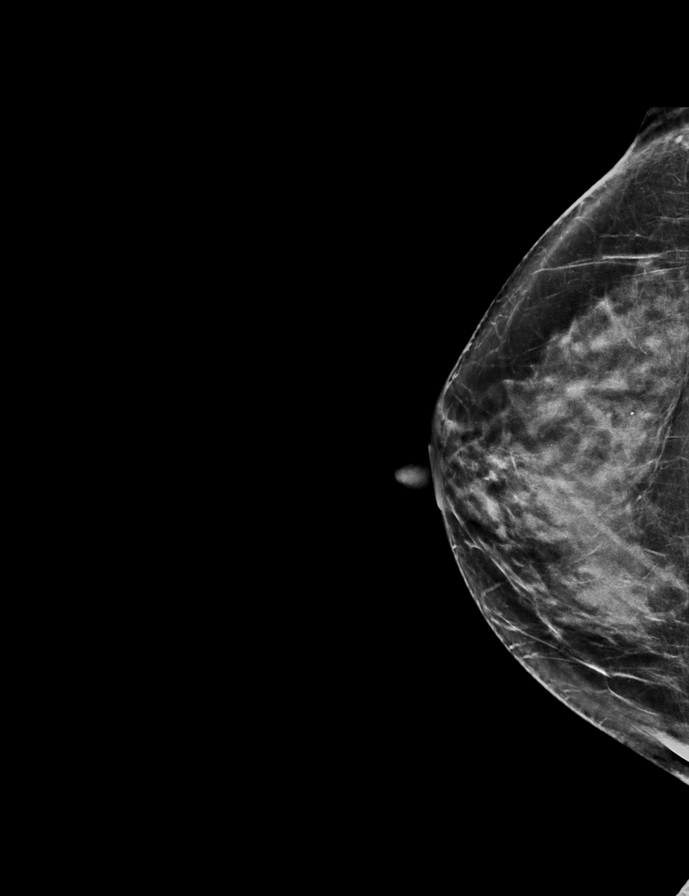

[L CC synth-2D]
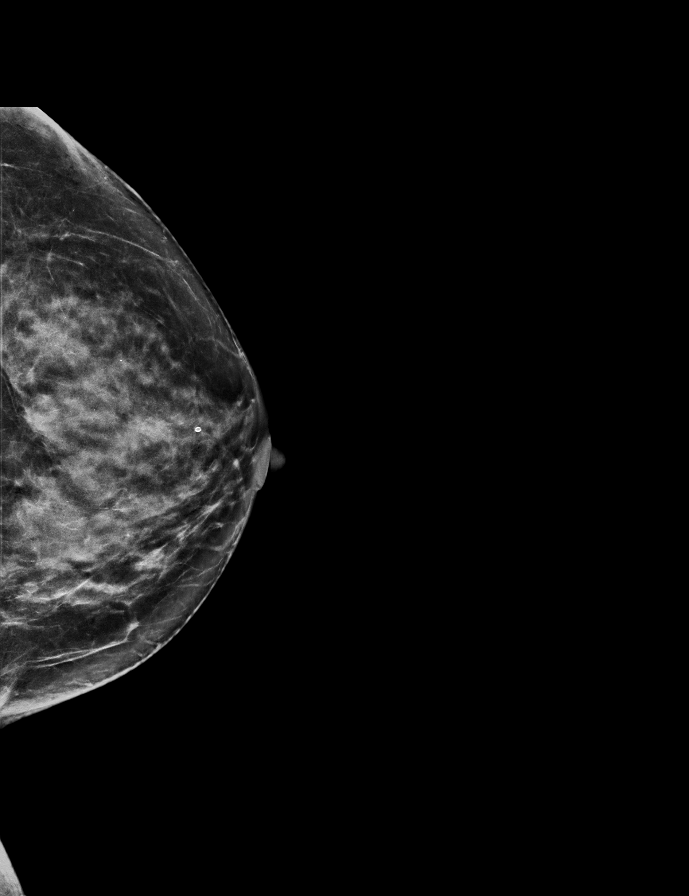

[L MLO synth-2D]
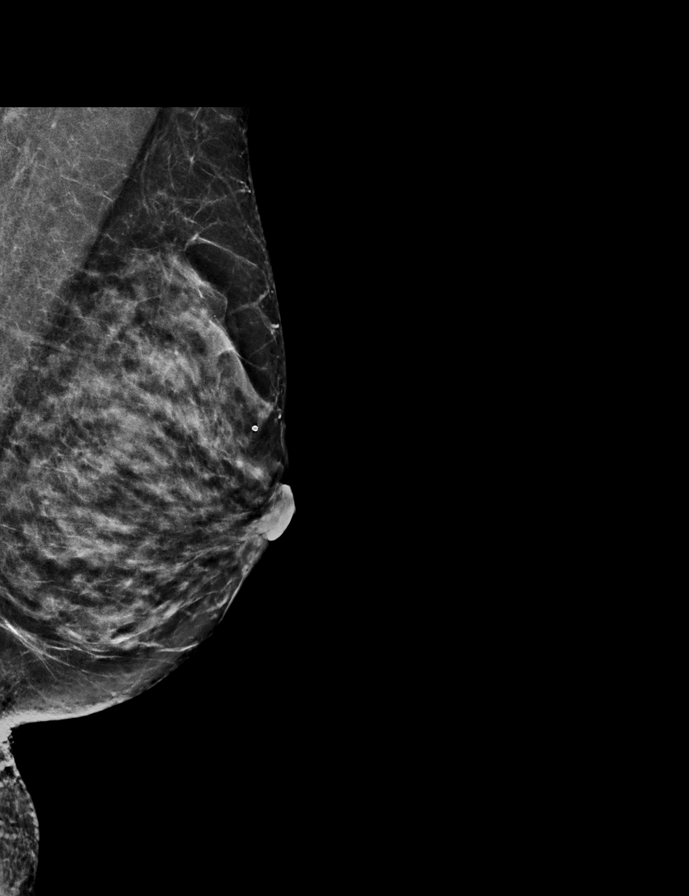

[R MLO synth-2D]
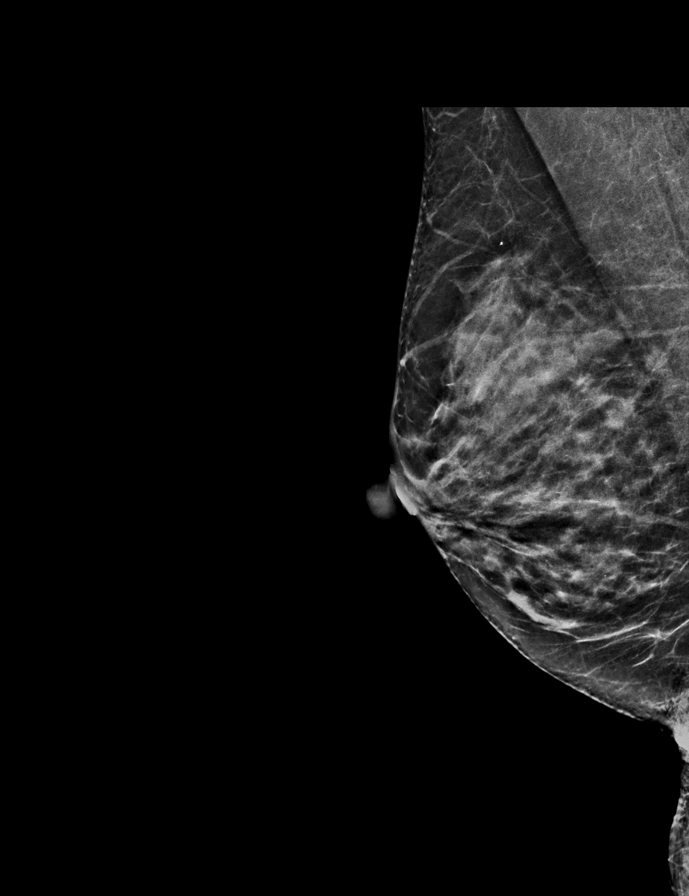

[R MLO tomo · 2 of 51 frames shown]
[frame 17/51]
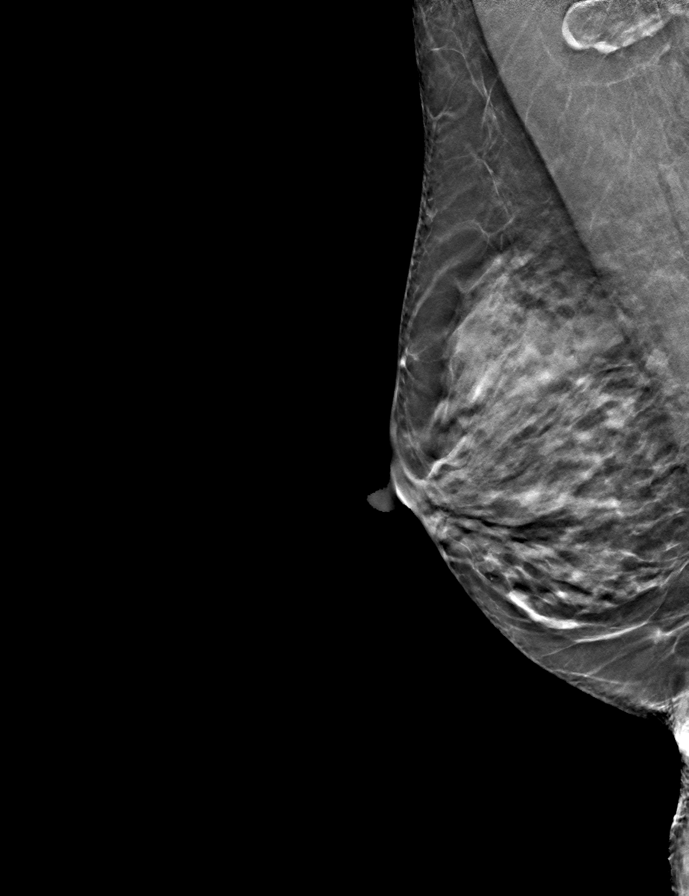
[frame 26/51]
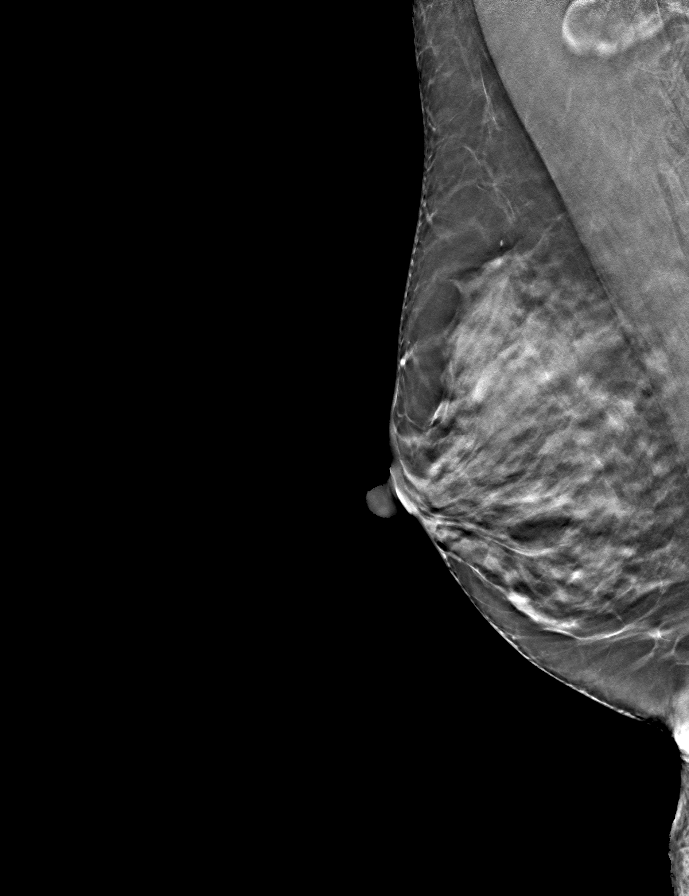

[L MLO tomo · tomo slice 29/58.0]
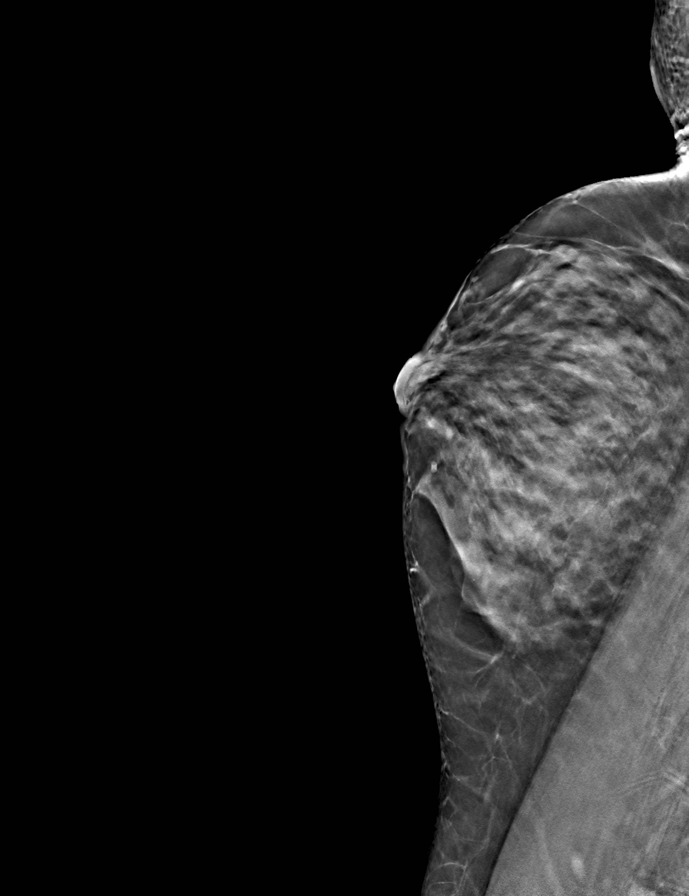

[R CC tomo · tomo slice 33/65.0]
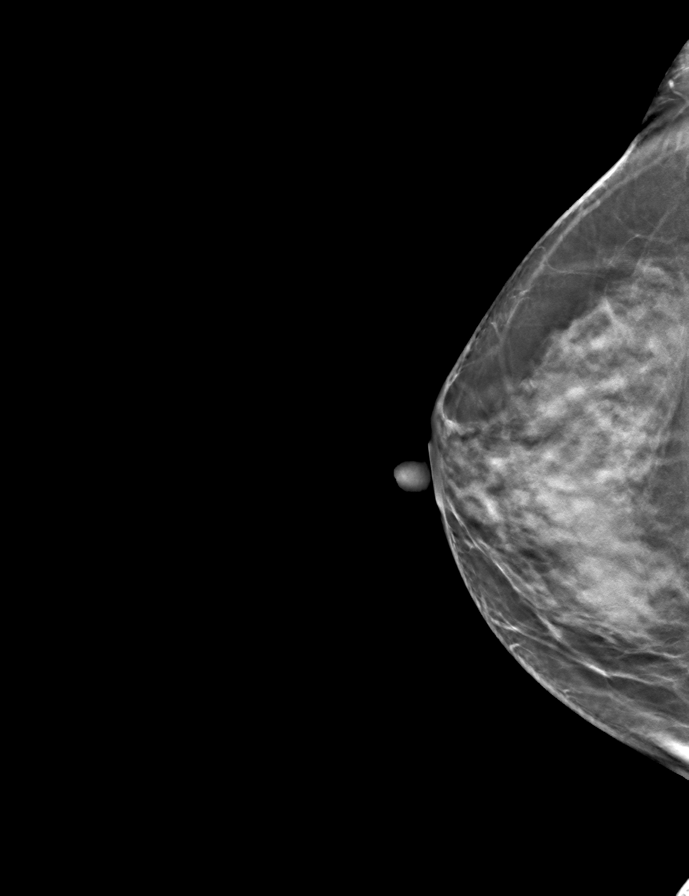

[L CC tomo · tomo slice 32/63.0]
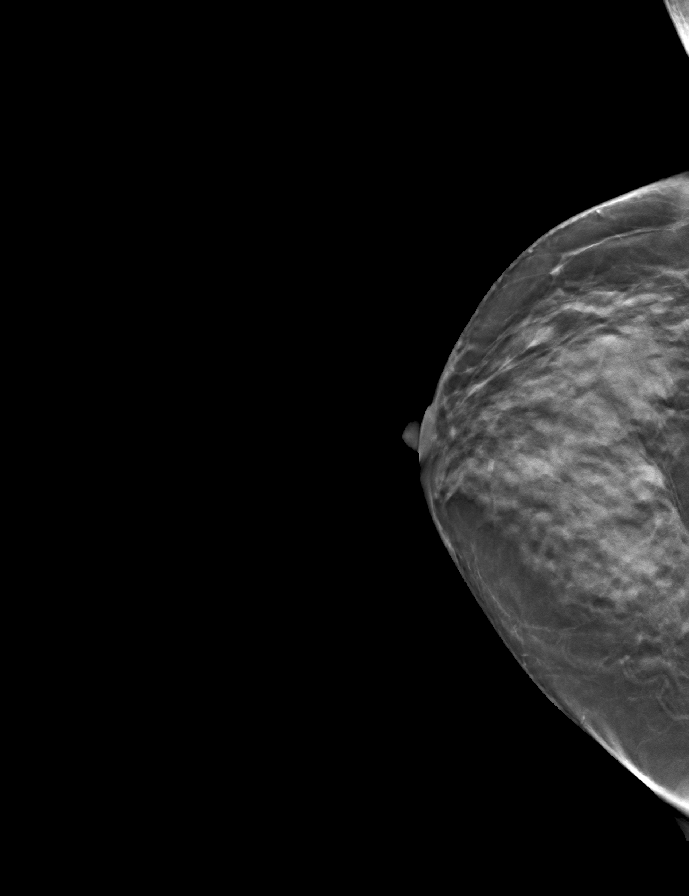

[9 of 24 positions shown; findings below may reference images not displayed]

ACR Breast Density Category c: The breast tissue is heterogeneously
dense, which may obscure small masses.
FINDINGS: There are no findings suspicious for malignancy. Images were
processed with CAD.
IMPRESSION: No mammographic evidence of malignancy. A result letter of this
screening mammogram will be mailed directly to the patient.

RECOMMENDATION:
Screening mammogram in one year. (Code:FT-U-LHB)

BI-RADS CATEGORY  1: Negative.

## 2020-08-31 ENCOUNTER — Ambulatory Visit
Admission: RE | Admit: 2020-08-31 | Discharge: 2020-08-31 | Disposition: A | Discharge status: Home / Self Care | Payer: Medicare HMO | Source: Ambulatory Visit | Attending: Internal Medicine | Admitting: Internal Medicine

## 2020-08-31 DIAGNOSIS — Z1231 Encounter for screening mammogram for malignant neoplasm of breast: Secondary | ICD-10-CM | POA: Insufficient documentation

## 2020-09-13 ENCOUNTER — Ambulatory Visit (INDEPENDENT_AMBULATORY_CARE_PROVIDER_SITE_OTHER): Payer: Medicare HMO | Admitting: Obstetrics & Gynecology

## 2020-09-13 ENCOUNTER — Other Ambulatory Visit: Payer: Self-pay

## 2020-09-13 ENCOUNTER — Encounter: Payer: Self-pay | Admitting: Obstetrics & Gynecology

## 2020-09-13 VITALS — BP 122/80 | Ht 63.5 in | Wt 130.0 lb

## 2020-09-13 DIAGNOSIS — Z1211 Encounter for screening for malignant neoplasm of colon: Secondary | ICD-10-CM

## 2020-09-13 DIAGNOSIS — Z01419 Encounter for gynecological examination (general) (routine) without abnormal findings: Secondary | ICD-10-CM

## 2020-09-13 MED ORDER — ESTROGENS CONJUGATED 0.9 MG PO TABS: 0.9000 mg | ORAL_TABLET | Freq: Every day | ORAL | 3 refills | Status: DC

## 2020-09-13 NOTE — Patient Instructions (Addendum)
PAP every 5 years Mammogram every year    Call [phone] to schedule at [facility] Colonoscopy every 10 years Labs yearly (with PCP)  Thank you for choosing [facility]. As part of our ongoing efforts to improve patient experience, we would appreciate your feedback. Please fill out the short survey that you will receive by mail or MyChart. Your opinion is important to us! - Dr. Willman Cuny  

## 2020-09-13 NOTE — Progress Notes (Signed)
HPI:      [name] is a 69 y.o. G1P1001 who LMP was in the past, she presents today for her annual examination.  The patient has no complaints today. The patient is sexually active. Herlast pap: was normal and last mammogram: approximate date 2021 and was normal.  The patient does perform self breast exams.  There is no notable family history of breast or ovarian cancer in her family. The patient is taking hormone replacement therapy. Patient denies post-menopausal vaginal bleeding.   The patient has regular exercise: yes. The patient denies current symptoms of depression.    GYN Hx: Last Colonoscopy:4 years ago. Normal.  Last DEXA: 2 years ago.    PMHx: Past Medical History:  Diagnosis Date  . Allergy   . Atypical mole    hx of removal of abnormal lesion by Dr. Sharlett Jensen about 45 years ago patient thinks it was an abnormal nevus  . Cat allergies   . Cervicalgia   . High cholesterol   . Hives   . Hypothyroidism   . Non-cardiac chest pain   . PSVT (paroxysmal supraventricular tachycardia) ([place])    Past Surgical History:  Procedure Laterality Date  . ABDOMINAL HYSTERECTOMY    . CARPAL TUNNEL RELEASE    . COLONOSCOPY WITH PROPOFOL N/A 04/10/2016   Procedure: COLONOSCOPY WITH PROPOFOL;  Surgeon: Kayla Class, MD;  Location: [facility] ENDOSCOPY;  Service: Endoscopy;  Laterality: N/A;  . Left foot surgery    . right hand ganglion removal     Family History  Problem Relation Age of Onset  . Breast cancer Maternal Aunt   . Breast cancer Cousin    Social History   Tobacco Use  . Smoking status: Never Smoker  . Smokeless tobacco: Never Used  Vaping Use  . Vaping Use: Never used  Substance Use Topics  . Alcohol use: No  . Drug use: No    Current Outpatient Medications:  .  Biotin 2500 MCG CAPS, Take 2 capsules by mouth every morning., Disp: , Rfl:  .  cetirizine (ZYRTEC) 10 MG tablet, Take 10 mg by mouth at bedtime., Disp: , Rfl:  .  levothyroxine (SYNTHROID) 50 MCG  tablet, Take by mouth., Disp: , Rfl:  .  magnesium oxide (MAG-OX) 400 MG tablet, Take 400 mg by mouth 2 (two) times daily., Disp: , Rfl:  .  metoprolol succinate (TOPROL-XL) 25 MG 24 hr tablet, Take by mouth., Disp: , Rfl:  .  niacin (SLO-NIACIN) 500 MG tablet, Take 500 mg by mouth at bedtime., Disp: , Rfl:  .  vitamin E 400 UNIT capsule, Take 400 Units by mouth daily., Disp: , Rfl:  .  estrogens, conjugated, (PREMARIN) 0.9 MG tablet, Take 1 tablet (0.9 mg total) by mouth daily. Take daily for 21 days then do not take for 7 days., Disp: 90 tablet, Rfl: 3 Allergies: Fosteum [genistein-zn chelate-vit d] and Hydrocodone-homatropine  Review of Systems  Constitutional: Negative for chills, fever and malaise/fatigue.  HENT: Negative for congestion, sinus pain and sore throat.   Eyes: Negative for blurred vision and pain.  Respiratory: Negative for cough and wheezing.   Cardiovascular: Negative for chest pain and leg swelling.  Gastrointestinal: Negative for abdominal pain, constipation, diarrhea, heartburn, nausea and vomiting.  Genitourinary: Negative for dysuria, frequency, hematuria and urgency.  Musculoskeletal: Negative for back pain, joint pain, myalgias and neck pain.  Skin: Negative for itching and rash.  Neurological: Negative for dizziness, tremors and weakness.  Endo/Heme/Allergies: Does not bruise/bleed  easily.  Psychiatric/Behavioral: Negative for depression. The patient is not nervous/anxious and does not have insomnia.   All other systems reviewed and are negative.   Objective: BP 122/80   Ht 5' 3.5" (1.613 m)   Wt 130 lb (59 kg)   BMI 22.67 kg/m   Filed Weights   09/13/20 1500  Weight: 130 lb (59 kg)   Body mass index is 22.67 kg/m. Physical Exam Constitutional:      General: She is not in acute distress.    Appearance: She is well-developed.  Genitourinary:     Pelvic exam was performed with patient supine.     Vulva, urethra, bladder, vagina and rectum normal.      No lesions in the vagina.     No vaginal bleeding.     Cervix is absent.     Uterus is absent.     No right or left adnexal mass present.     Right adnexa not tender.     Left adnexa not tender.     Genitourinary Comments: Vaginal cuff well healed  HENT:     Head: Normocephalic and atraumatic. No laceration.     Right Ear: Hearing normal.     Left Ear: Hearing normal.     Mouth/Throat:     Pharynx: Uvula midline.  Eyes:     Pupils: Pupils are equal, round, and reactive to light.  Neck:     Thyroid: No thyromegaly.  Cardiovascular:     Rate and Rhythm: Normal rate and regular rhythm.     Heart sounds: No murmur heard.  No friction rub. No gallop.   Pulmonary:     Effort: Pulmonary effort is normal. No respiratory distress.     Breath sounds: Normal breath sounds. No wheezing.  Chest:     Breasts:        Right: No mass, skin change or tenderness.        Left: No mass, skin change or tenderness.  Abdominal:     General: Bowel sounds are normal. There is no distension.     Palpations: Abdomen is soft.     Tenderness: There is no abdominal tenderness. There is no rebound.  Musculoskeletal:        General: Normal range of motion.     Cervical back: Normal range of motion and neck supple.  Neurological:     Mental Status: She is alert and oriented to person, place, and time.     Cranial Nerves: No cranial nerve deficit.  Skin:    General: Skin is warm and dry.  Psychiatric:        Judgment: Judgment normal.  Vitals reviewed.     Assessment: Annual Exam 1. Women's annual routine gynecological examination   2. Screen for colon cancer     Plan:            1.  Vaginal Screening-  Pap smear schedule reviewed with patient  2. Breast screening- Exam annually and mammogram scheduled  3. Colonoscopy every 10 years, Hemoccult testing after age 45  4. Labs managed by PCP  5. Counseling for hormonal therapy: wants to change HRT or dose, takes due to moodiness - Premarin  0.9 mg daily eRx              6. FRAX - FRAX score for assessing the 10 year probability for fracture calculated and discussed today.  Based on age and score today, DEXA is not currently scheduled.    F/U  Return in about 1 year (around 09/13/2021) for Annual.  Barnett Applebaum, MD, [facility], [facility] 09/13/2020  4:09 PM

## 2020-09-18 LAB — SPECIMEN STATUS REPORT

## 2020-09-18 LAB — FECAL OCCULT BLOOD, IMMUNOCHEMICAL: Fecal Occult Bld: NEGATIVE

## 2020-09-24 ENCOUNTER — Other Ambulatory Visit
Admission: RE | Admit: 2020-09-24 | Discharge: 2020-09-24 | Disposition: A | Discharge status: Home / Self Care | Payer: Medicare HMO | Source: Ambulatory Visit | Attending: Student | Admitting: Student

## 2020-09-24 DIAGNOSIS — Z01818 Encounter for other preprocedural examination: Secondary | ICD-10-CM | POA: Insufficient documentation

## 2020-09-24 DIAGNOSIS — I471 Supraventricular tachycardia: Secondary | ICD-10-CM | POA: Insufficient documentation

## 2020-09-24 DIAGNOSIS — E7800 Pure hypercholesterolemia, unspecified: Secondary | ICD-10-CM | POA: Diagnosis present

## 2020-09-24 LAB — BRAIN NATRIURETIC PEPTIDE: B Natriuretic Peptide: 166.1 pg/mL — ABNORMAL HIGH (ref 0.0–100.0)

## 2020-10-05 ENCOUNTER — Other Ambulatory Visit
Admission: RE | Admit: 2020-10-05 | Discharge: 2020-10-05 | Disposition: A | Discharge status: Home / Self Care | Payer: Medicare HMO | Source: Ambulatory Visit | Attending: Internal Medicine | Admitting: Internal Medicine

## 2020-10-05 ENCOUNTER — Other Ambulatory Visit: Payer: Self-pay

## 2020-10-05 DIAGNOSIS — Z01818 Encounter for other preprocedural examination: Secondary | ICD-10-CM | POA: Diagnosis present

## 2020-10-05 DIAGNOSIS — Z20822 Contact with and (suspected) exposure to covid-19: Secondary | ICD-10-CM | POA: Diagnosis not present

## 2020-10-06 LAB — SARS CORONAVIRUS 2 (TAT 6-24 HRS): SARS Coronavirus 2: NEGATIVE

## 2020-10-07 ENCOUNTER — Ambulatory Visit
Admission: RE | Admit: 2020-10-07 | Discharge: 2020-10-07 | Disposition: A | Discharge status: Home / Self Care | Payer: Medicare HMO | Attending: Internal Medicine | Admitting: Internal Medicine

## 2020-10-07 ENCOUNTER — Encounter
Admission: RE | Disposition: A | Discharge status: Home / Self Care | Payer: Self-pay | Source: Home / Self Care | Attending: Internal Medicine

## 2020-10-07 DIAGNOSIS — R9430 Abnormal result of cardiovascular function study, unspecified: Secondary | ICD-10-CM | POA: Insufficient documentation

## 2020-10-07 DIAGNOSIS — I209 Angina pectoris, unspecified: Secondary | ICD-10-CM | POA: Diagnosis not present

## 2020-10-07 DIAGNOSIS — E782 Mixed hyperlipidemia: Secondary | ICD-10-CM | POA: Insufficient documentation

## 2020-10-07 DIAGNOSIS — E039 Hypothyroidism, unspecified: Secondary | ICD-10-CM | POA: Insufficient documentation

## 2020-10-07 DIAGNOSIS — F419 Anxiety disorder, unspecified: Secondary | ICD-10-CM | POA: Insufficient documentation

## 2020-10-07 DIAGNOSIS — I471 Supraventricular tachycardia: Secondary | ICD-10-CM | POA: Diagnosis not present

## 2020-10-07 DIAGNOSIS — Z79890 Hormone replacement therapy (postmenopausal): Secondary | ICD-10-CM | POA: Diagnosis not present

## 2020-10-07 DIAGNOSIS — I200 Unstable angina: Secondary | ICD-10-CM

## 2020-10-07 DIAGNOSIS — Z87891 Personal history of nicotine dependence: Secondary | ICD-10-CM | POA: Insufficient documentation

## 2020-10-07 DIAGNOSIS — Z79899 Other long term (current) drug therapy: Secondary | ICD-10-CM | POA: Diagnosis not present

## 2020-10-07 HISTORY — PX: LEFT HEART CATH AND CORONARY ANGIOGRAPHY: CATH118249

## 2020-10-07 SURGERY — LEFT HEART CATH AND CORONARY ANGIOGRAPHY
Anesthesia: Moderate Sedation | Laterality: Left

## 2020-10-07 MED ORDER — SODIUM CHLORIDE 0.9 % WEIGHT BASED INFUSION
1.0000 mL/kg/h | INTRAVENOUS | Status: DC
  Administered 2020-10-07: 1 mL/kg/h via INTRAVENOUS

## 2020-10-07 MED ORDER — FENTANYL CITRATE (PF) 100 MCG/2ML IJ SOLN
INTRAMUSCULAR | Status: AC
  Filled 2020-10-07: qty 2

## 2020-10-07 MED ORDER — MIDAZOLAM HCL 2 MG/2ML IJ SOLN
INTRAMUSCULAR | Status: DC | PRN
  Administered 2020-10-07: 1 mg via INTRAVENOUS

## 2020-10-07 MED ORDER — SODIUM CHLORIDE 0.9% FLUSH: 3.0000 mL | Freq: Two times a day (BID) | INTRAVENOUS | Status: DC

## 2020-10-07 MED ORDER — SODIUM CHLORIDE 0.9 % IV SOLN: 250.0000 mL | INTRAVENOUS | Status: DC | PRN

## 2020-10-07 MED ORDER — SODIUM CHLORIDE 0.9% FLUSH: 3.0000 mL | INTRAVENOUS | Status: DC | PRN

## 2020-10-07 MED ORDER — IOHEXOL 300 MG/ML  SOLN
INTRAMUSCULAR | Status: DC | PRN
  Administered 2020-10-07: 30 mL

## 2020-10-07 MED ORDER — HEPARIN SODIUM (PORCINE) 1000 UNIT/ML IJ SOLN
INTRAMUSCULAR | Status: AC
  Filled 2020-10-07: qty 1

## 2020-10-07 MED ORDER — ASPIRIN 81 MG PO CHEW
CHEWABLE_TABLET | ORAL | Status: AC
  Filled 2020-10-07: qty 1

## 2020-10-07 MED ORDER — VERAPAMIL HCL 2.5 MG/ML IV SOLN
INTRAVENOUS | Status: DC | PRN
  Administered 2020-10-07: 2.5 mg via INTRA_ARTERIAL

## 2020-10-07 MED ORDER — MIDAZOLAM HCL 2 MG/2ML IJ SOLN
INTRAMUSCULAR | Status: AC
  Filled 2020-10-07: qty 2

## 2020-10-07 MED ORDER — HEPARIN SODIUM (PORCINE) 1000 UNIT/ML IJ SOLN
INTRAMUSCULAR | Status: DC | PRN
  Administered 2020-10-07: 3000 [IU] via INTRAVENOUS

## 2020-10-07 MED ORDER — HEPARIN (PORCINE) IN NACL 1000-0.9 UT/500ML-% IV SOLN
INTRAVENOUS | Status: DC | PRN
  Administered 2020-10-07: 500 mL

## 2020-10-07 MED ORDER — HEPARIN (PORCINE) IN NACL 1000-0.9 UT/500ML-% IV SOLN
INTRAVENOUS | Status: AC
  Filled 2020-10-07: qty 1000

## 2020-10-07 MED ORDER — FENTANYL CITRATE (PF) 100 MCG/2ML IJ SOLN
INTRAMUSCULAR | Status: DC | PRN
  Administered 2020-10-07: 25 ug via INTRAVENOUS

## 2020-10-07 MED ORDER — ASPIRIN 81 MG PO CHEW
81.0000 mg | CHEWABLE_TABLET | ORAL | Status: AC
  Administered 2020-10-07: 81 mg via ORAL

## 2020-10-07 MED ORDER — SODIUM CHLORIDE 0.9 % WEIGHT BASED INFUSION
3.0000 mL/kg/h | INTRAVENOUS | Status: AC
  Administered 2020-10-07: 3 mL/kg/h via INTRAVENOUS

## 2020-10-07 MED ORDER — VERAPAMIL HCL 2.5 MG/ML IV SOLN
INTRAVENOUS | Status: AC
  Filled 2020-10-07: qty 2

## 2020-10-07 SURGICAL SUPPLY — 9 items
CATH INFINITI 5 FR JL3.5 (CATHETERS) ×3 IMPLANT
CATH INFINITI JR4 5F (CATHETERS) ×3 IMPLANT
DEVICE RAD TR BAND REGULAR (VASCULAR PRODUCTS) ×3 IMPLANT
GLIDESHEATH SLEND SS 6F .021 (SHEATH) ×3 IMPLANT
GUIDEWIRE INQWIRE 1.5J.035X260 (WIRE) ×1 IMPLANT
INQWIRE 1.5J .035X260CM (WIRE) ×3
KIT HEART LEFT (KITS) ×3 IMPLANT
KIT RIGHT HEART (MISCELLANEOUS) ×3 IMPLANT
PACK CARDIAC CATH (CUSTOM PROCEDURE TRAY) ×3 IMPLANT

## 2020-10-07 NOTE — Progress Notes (Signed)
TR Band removed, gauze/opsite in place, no bleeding noted, no hematoma noted. Instructions given regarding radial care, pt and husband verbalize understanding.

## 2020-10-07 NOTE — Progress Notes (Signed)
Dr. Clayborn Bigness spoke to patient and patient's husband regarding procedure , both verbalize understanding.

## 2020-10-08 ENCOUNTER — Encounter: Payer: Self-pay | Admitting: Internal Medicine

## 2021-03-30 ENCOUNTER — Other Ambulatory Visit: Payer: Self-pay

## 2021-03-30 MED ORDER — DOXYCYCLINE HYCLATE 20 MG PO TABS: 20.0000 mg | ORAL_TABLET | Freq: Two times a day (BID) | ORAL | 0 refills | Status: DC

## 2021-03-30 NOTE — Progress Notes (Signed)
Patient requested 90 supply of doxycycline as she is running out and about to leave the country. RX sent in with no RFs.

## 2021-05-11 ENCOUNTER — Other Ambulatory Visit: Payer: Self-pay

## 2021-05-11 ENCOUNTER — Ambulatory Visit (INDEPENDENT_AMBULATORY_CARE_PROVIDER_SITE_OTHER): Payer: Medicare HMO | Admitting: Dermatology

## 2021-05-11 DIAGNOSIS — D229 Melanocytic nevi, unspecified: Secondary | ICD-10-CM

## 2021-05-11 DIAGNOSIS — Z1283 Encounter for screening for malignant neoplasm of skin: Secondary | ICD-10-CM

## 2021-05-11 DIAGNOSIS — L814 Other melanin hyperpigmentation: Secondary | ICD-10-CM

## 2021-05-11 DIAGNOSIS — D1800 Hemangioma unspecified site: Secondary | ICD-10-CM

## 2021-05-11 DIAGNOSIS — L719 Rosacea, unspecified: Secondary | ICD-10-CM

## 2021-05-11 DIAGNOSIS — D1721 Benign lipomatous neoplasm of skin and subcutaneous tissue of right arm: Secondary | ICD-10-CM

## 2021-05-11 DIAGNOSIS — L578 Other skin changes due to chronic exposure to nonionizing radiation: Secondary | ICD-10-CM | POA: Diagnosis not present

## 2021-05-11 DIAGNOSIS — L821 Other seborrheic keratosis: Secondary | ICD-10-CM

## 2021-05-11 MED ORDER — DOXYCYCLINE HYCLATE 20 MG PO TABS: 20.0000 mg | ORAL_TABLET | Freq: Two times a day (BID) | ORAL | 3 refills | Status: AC

## 2021-05-11 NOTE — Patient Instructions (Signed)
Kayla Jensen [place] [id number]   You have an upcoming surgery appointment scheduled at [facility]. @FUTURESURGAPPT @  PRE-OPERATIVE INSTRUCTIONS  We recommend you read the following instructions. If you have any questions or concerns, please call the office at [phone].  1. Shower and wash the entire body with soap and water the day of your surgery paying special attention to cleansing at and around the planned surgery site. 2. Avoid aspirin or aspirin containing products at least ten (10) days prior to your surgical procedure and for at least one week (7 days) after your surgical procedure. If you take aspirin on a regular basis for heart disease or history of stroke or for any other reason, we may recommend you continue taking aspirin but please notify us if you take this on a regular basis. Aspirin can cause more bleeding to occur during surgery as well as prolonged bleeding and bruising after surgery. 3. Avoid other nonsteroidal pain medications at least one week prior to surgery and at least one week after your surgery. These include medications such as Ibuprofen (Motrin, Advil and Nuprin), Naprosyn, Voltaren, Relafen, etc. If these medications are used for therapeutic reasons, please inform us as they can cause increased bleeding or prolonged bleeding during and bruising after surgical procedures.  4. Please advise [place] if you are taking any "blood thinner" medications such as Coumadin or Dipyridamole or Plavix or similar medications. These cause increased bleeding and prolonged bleeding during procedures and bruising after surgical procedures. We may have to consider discontinuing these medications briefly prior to and shortly after your surgery if safe to do so. 5. Please inform us of all medications you are currently taking. All medications that are taken regularly should be taken the day of surgery as you always do. Nevertheless, we need to be informed  of what medications you are taking prior to surgery to know whether they will affect the procedure or cause any complications. 6. Please inform us of any medication allergies. Also inform us of whether you have allergies to Latex or rubber products or whether you have had any adverse reaction to Lidocaine or Epinephrine. 7. Please inform us of any prosthetic or artificial body parts such as artificial heart valve, joint replacements, etc., or similar condition that might require preoperative antibiotics. 8. We recommend avoidance of alcohol at least two weeks prior to surgery and continued avoidance for at least two weeks after surgery. 9. We recommend avoidance of tobacco smoking at least two weeks prior to surgery and continued abstinence for at least two weeks after surgery. 10. Do not plan strenuous exercise, strenuous work or strenuous lifting for approximately four weeks after your surgery. 11. We request if you are unable to make your scheduled surgical appointment, please call us at least a week in advance or as soon as you are aware of a problem so that we can cancel or reschedule the appointment. 12. You MAY TAKE TYLENOL (acetaminophen) for pain as it is not a blood thinner. 13. PLEASE PLAN TO BE IN TOWN FOR TWO WEEKS FOLLOWING SURGERY. THIS IS IMPORTANT SO YOU CAN BE CHECKED FOR DRESSING CHANGES, SUTURE REMOVAL AND TO MONITOR FOR POSSIBLE COMPLICATIONS.   If you have any questions or concerns for your doctor, please call our main line at [phone] and press option 4 to reach your doctor's medical assistant. If no one answers, please leave a voicemail as directed and we will return your call as soon as possible. Messages  left after 4 pm will be answered the following business day.   You may also send [place] a message via [place]. We typically respond to MyChart messages within 1-2 business days.  For prescription refills, please ask your pharmacy to contact our office. Our fax number is  [phone].  If you have an urgent issue when the clinic is closed that cannot wait until the next business day, you can page your doctor at the number below.    Please note that while we do our best to be available for urgent issues outside of office hours, we are not available 24/7.   If you have an urgent issue and are unable to reach [place], you may choose to seek medical care at your doctor's office, retail clinic, urgent care center, or emergency room.  If you have a medical emergency, please immediately call 911 or go to the emergency department.  Pager Numbers  - Dr. Nehemiah Massed: [phone]  - Dr. Laurence Ferrari: [phone]  - Dr. Nicole Kindred: [phone]  In the event of inclement weather, please call our main line at [phone] for an update on the status of any delays or closures.  Dermatology Medication Tips: Please keep the boxes that topical medications come in in order to help keep track of the instructions about where and how to use these. Pharmacies typically print the medication instructions only on the boxes and not directly on the medication tubes.   If your medication is too expensive, please contact our office at [phone] option 4 or send [place] a message through [place].   We are unable to tell what your co-pay for medications will be in advance as this is different depending on your insurance coverage. However, we may be able to find a substitute medication at lower cost or fill out paperwork to get insurance to cover a needed medication.   If a prior authorization is required to get your medication covered by your insurance company, please allow [place] 1-2 business days to complete this process.  Drug prices often vary depending on where the prescription is filled and some pharmacies may offer cheaper prices.  The website www.goodrx.com contains coupons for medications through different pharmacies. The prices here do not account for what the cost may be with help from  insurance (it may be cheaper with your insurance), but the website can give you the price if you did not use any insurance.  - You can print the associated coupon and take it with your prescription to the pharmacy.  - You may also stop by our office during regular business hours and pick up a GoodRx coupon card.  - If you need your prescription sent electronically to a different pharmacy, notify our office through [facility] or by phone at [phone] option 4.

## 2021-05-24 NOTE — Progress Notes (Signed)
   Follow-Up Visit   Subjective  Kayla Jensen is a 70 y.o. female who presents for the following: Annual Exam (History of abnormal moles years ago removed by Dr. Sharlett Iles - TBSE today) The patient presents for Total-Body Skin Exam (TBSE) for skin cancer screening and mole check.  The following portions of the chart were reviewed this encounter and updated as appropriate:   Tobacco  Allergies  Meds  Problems  Med Hx  Surg Hx  Fam Hx     Review of Systems:  No other skin or systemic complaints except as noted in HPI or Assessment and Plan.  Objective  Well appearing patient in no apparent distress; mood and affect are within normal limits.  A full examination was performed including scalp, head, eyes, ears, nose, lips, neck, chest, axillae, abdomen, back, buttocks, bilateral upper extremities, bilateral lower extremities, hands, feet, fingers, toes, fingernails, and toenails. All findings within normal limits unless otherwise noted below.  Objective  Head - Anterior (Face): Mild pinkness  Objective  Right Forearm - Posterior: Rubbery nodules x 2   Assessment & Plan    Lentigines - Scattered tan macules - Due to sun exposure - Benign-appering, observe - Recommend daily broad spectrum sunscreen SPF 30+ to sun-exposed areas, reapply every 2 hours as needed. - Call for any changes  Seborrheic Keratoses - Stuck-on, waxy, tan-brown papules and/or plaques  - Benign-appearing - Discussed benign etiology and prognosis. - Observe - Call for any changes  Melanocytic Nevi - Tan-brown and/or pink-flesh-colored symmetric macules and papules - Benign appearing on exam today - Observation - Call clinic for new or changing moles - Recommend daily use of broad spectrum spf 30+ sunscreen to sun-exposed areas.   Hemangiomas - Red papules - Discussed benign nature - Observe - Call for any changes  Actinic Damage - Chronic condition, secondary to cumulative UV/sun exposure -  diffuse scaly erythematous macules with underlying dyspigmentation - Recommend daily broad spectrum sunscreen SPF 30+ to sun-exposed areas, reapply every 2 hours as needed.  - Staying in the shade or wearing long sleeves, sun glasses (UVA+UVB protection) and wide brim hats (4-inch brim around the entire circumference of the hat) are also recommended for sun protection.  - Call for new or changing lesions.  Skin cancer screening performed today.  Rosacea Head - Anterior (Face) Rosacea is a chronic progressive skin condition usually affecting the face of adults, causing redness and/or acne bumps. It is treatable but not curable. It sometimes affects the eyes (ocular rosacea) as well. It may respond to topical and/or systemic medication and can flare with stress, sun exposure, alcohol, exercise and some foods.  Daily application of broad spectrum spf 30+ sunscreen to face is recommended to reduce flares. Well controlled Continue Metronidazole gel qhs prn, Doxycycline 20 mg 1 po bid with food and plenty of fluid Reordered Medications doxycycline (PERIOSTAT) 20 MG tablet  Lipoma of right upper extremity Right Forearm - Posterior Discussed excision. Patient will schedule - can do both in one appointment  Return for Surgery lipomas of right forearm, 1 year for TBSE.  I, Ashok Cordia, CMA, am acting as scribe for Sarina Ser, MD .  Documentation: I have reviewed the above documentation for accuracy and completeness, and I agree with the above.  Sarina Ser, MD

## 2021-05-27 ENCOUNTER — Encounter: Payer: Self-pay | Admitting: Dermatology

## 2021-08-30 ENCOUNTER — Other Ambulatory Visit: Payer: Self-pay | Admitting: Internal Medicine

## 2021-08-30 DIAGNOSIS — Z1231 Encounter for screening mammogram for malignant neoplasm of breast: Secondary | ICD-10-CM

## 2021-09-14 ENCOUNTER — Encounter: Payer: Self-pay | Admitting: Obstetrics & Gynecology

## 2021-09-14 ENCOUNTER — Other Ambulatory Visit: Payer: Self-pay

## 2021-09-14 ENCOUNTER — Ambulatory Visit (INDEPENDENT_AMBULATORY_CARE_PROVIDER_SITE_OTHER): Payer: Medicare HMO | Admitting: Obstetrics & Gynecology

## 2021-09-14 VITALS — BP 120/80 | Ht 63.0 in | Wt 135.0 lb

## 2021-09-14 DIAGNOSIS — Z780 Asymptomatic menopausal state: Secondary | ICD-10-CM

## 2021-09-14 MED ORDER — ESTROGENS CONJUGATED 0.9 MG PO TABS: 0.9000 mg | ORAL_TABLET | Freq: Every day | ORAL | 3 refills | Status: DC

## 2021-09-14 NOTE — Progress Notes (Signed)
History of Present Illness:  Kayla Jensen is a 70 y.o. who was started on ERT years ago and dose changed for Premarin 0.9 mg daily (21 d on, 7 d off)  approximately 12 months ago. Since that time, she states that her symptoms show no change.  PMHx: She  has a past medical history of Allergy, Atypical mole, Cat allergies, Cervicalgia, High cholesterol, Hives, Hypothyroidism, Non-cardiac chest pain, and PSVT (paroxysmal supraventricular tachycardia) ([place]). Also,  has a past surgical history that includes Abdominal hysterectomy; Left foot surgery; Carpal tunnel release; right hand ganglion removal; Colonoscopy with propofol (N/A, 04/10/2016); and LEFT HEART CATH AND CORONARY ANGIOGRAPHY (Left, 10/07/2020)., family history includes Breast cancer in her cousin and maternal aunt.,  reports that she has never smoked. She has never used smokeless tobacco. She reports that she does not drink alcohol and does not use drugs. Current Meds  Medication Sig   Biotin 10000 MCG TABS Take 10,000 mcg by mouth daily.   Calcium Citrate-Vitamin D (CALCIUM + D PO) Take 1 tablet by mouth daily.   Calcium-Vitamin D-Vitamin K (VIACTIV CALCIUM PLUS D PO) Take 1 tablet by mouth daily.   cetirizine (ZYRTEC) 10 MG tablet Take 10 mg by mouth daily as needed for allergies.    cholecalciferol (VITAMIN D3) 25 MCG (1000 UNIT) tablet Take 1,000 Units by mouth daily.   doxycycline (PERIOSTAT) 20 MG tablet Take 1 tablet (20 mg total) by mouth 2 (two) times daily. With food and plenty of fluid   magnesium oxide (MAG-OX) 400 MG tablet Take 400 mg by mouth 2 (two) times daily.   meloxicam (MOBIC) 15 MG tablet Take 15 mg by mouth daily.   metroNIDAZOLE (METROGEL) 1 % gel Apply 1 application topically at bedtime.   Multiple Vitamin (MULTIVITAMIN WITH MINERALS) TABS tablet Take 1 tablet by mouth daily.   niacin 500 MG tablet Take 500 mg by mouth daily.   vitamin E 400 UNIT capsule Take 400 Units by mouth daily.   [DISCONTINUED] estrogens,  conjugated, (PREMARIN) 0.9 MG tablet Take 1 tablet (0.9 mg total) by mouth daily. Take daily for 21 days then do not take for 7 days. (Patient taking differently: Take 0.9 mg by mouth See admin instructions. Take 0.9 mg daily for 21 days then do not take for 7 days.)  . Also, is allergic to baclofen, fosteum [genistein-zn chelate-vit d], hydrocodone, and hydrocodone bit-homatrop mbr..  Review of Systems  Constitutional:  Negative for chills, fever and malaise/fatigue.  HENT:  Negative for congestion, sinus pain and sore throat.   Eyes:  Negative for blurred vision and pain.  Respiratory:  Negative for cough and wheezing.   Cardiovascular:  Negative for chest pain and leg swelling.  Gastrointestinal:  Negative for abdominal pain, constipation, diarrhea, heartburn, nausea and vomiting.  Genitourinary:  Negative for dysuria, frequency, hematuria and urgency.  Musculoskeletal:  Negative for back pain, joint pain, myalgias and neck pain.  Skin:  Negative for itching and rash.  Neurological:  Negative for dizziness, tremors and weakness.  Endo/Heme/Allergies:  Does not bruise/bleed easily.  Psychiatric/Behavioral:  Negative for depression. The patient is not nervous/anxious and does not have insomnia.    Physical Exam:  BP 120/80   Ht 5\' 3"  (1.6 m)   Wt 135 lb (61.2 kg)   BMI 23.91 kg/m  Body mass index is 23.91 kg/m. Physical Exam Constitutional:      General: She is not in acute distress.    Appearance: She is well-developed.  Genitourinary:  Vulva, bladder, rectum and urethral meatus normal.     No lesions in the vagina.     Genitourinary Comments: Vaginal cuff well healed     Right Labia: No rash, tenderness or lesions.    Left Labia: No tenderness, lesions or rash.    No vaginal bleeding.      Right Adnexa: not tender and no mass present.    Left Adnexa: not tender and no mass present.    Cervix is absent.     Uterus is absent.     Pelvic exam was performed with patient in  the lithotomy position.  Breasts:    Right: No mass, skin change or tenderness.     Left: No mass, skin change or tenderness.  HENT:     Head: Normocephalic and atraumatic. No laceration.     Right Ear: Hearing normal.     Left Ear: Hearing normal.     Mouth/Throat:     Pharynx: Uvula midline.  Eyes:     Pupils: Pupils are equal, round, and reactive to light.  Neck:     Thyroid: No thyromegaly.  Cardiovascular:     Rate and Rhythm: Normal rate and regular rhythm.     Heart sounds: No murmur heard.   No friction rub. No gallop.  Pulmonary:     Effort: Pulmonary effort is normal. No respiratory distress.     Breath sounds: Normal breath sounds. No wheezing.  Abdominal:     General: Bowel sounds are normal. There is no distension.     Palpations: Abdomen is soft.     Tenderness: There is no abdominal tenderness. There is no rebound.  Musculoskeletal:        General: Normal range of motion.     Cervical back: Normal range of motion and neck supple.  Neurological:     Mental Status: She is alert and oriented to person, place, and time.     Cranial Nerves: No cranial nerve deficit.  Skin:    General: Skin is warm and dry.  Psychiatric:        Judgment: Judgment normal.  Vitals reviewed.    Assessment:  Problem List Items Addressed This Visit       Other   Menopause - Primary   Medication treatment is going very well for her menopausal symptoms.  Plan: She will undergo no change in her medical therapy.    Discussed pros and cons of ERT    Will plan taper off over the next few years  Breast cancer screening discussed.  Breast exam today.  GU exam, no s/sx cystocele.  Denies worsening bladder symptoms or incontinence.  She was amenable to this plan and we will see her back for annual/PRN.  A total of 30 minutes were spent face-to-face with the patient as well as preparation, review, communication, and documentation during this encounter.   Barnett Applebaum, MD,  [facility], [facility] 09/14/2021  2:58 PM

## 2021-09-16 ENCOUNTER — Other Ambulatory Visit: Payer: Self-pay

## 2021-09-16 ENCOUNTER — Ambulatory Visit
Admission: RE | Admit: 2021-09-16 | Discharge: 2021-09-16 | Disposition: A | Discharge status: Home / Self Care | Payer: Medicare HMO | Source: Ambulatory Visit | Attending: Internal Medicine | Admitting: Internal Medicine

## 2021-09-16 DIAGNOSIS — Z1231 Encounter for screening mammogram for malignant neoplasm of breast: Secondary | ICD-10-CM | POA: Insufficient documentation

## 2021-09-20 ENCOUNTER — Other Ambulatory Visit: Payer: Self-pay

## 2021-09-20 ENCOUNTER — Ambulatory Visit (INDEPENDENT_AMBULATORY_CARE_PROVIDER_SITE_OTHER): Payer: Medicare HMO | Admitting: Dermatology

## 2021-09-20 DIAGNOSIS — D485 Neoplasm of uncertain behavior of skin: Secondary | ICD-10-CM | POA: Diagnosis not present

## 2021-09-20 DIAGNOSIS — D492 Neoplasm of unspecified behavior of bone, soft tissue, and skin: Secondary | ICD-10-CM

## 2021-09-20 MED ORDER — MUPIROCIN 2 % EX OINT: 1.0000 "application " | TOPICAL_OINTMENT | Freq: Every day | CUTANEOUS | 1 refills | Status: DC

## 2021-09-20 NOTE — Patient Instructions (Signed)
Wound Care Instructions  Cleanse wound gently with soap and water once a day then pat dry with clean gauze. Apply a thing coat of Petrolatum (petroleum jelly, "Vaseline") over the wound (unless you have an allergy to this). We recommend that you use a new, sterile tube of Vaseline. Do not pick or remove scabs. Do not remove the yellow or white "healing tissue" from the base of the wound.  Cover the wound with fresh, clean, nonstick gauze and secure with paper tape. You may use Band-Aids in place of gauze and tape if the would is small enough, but would recommend trimming much of the tape off as there is often too much. Sometimes Band-Aids can irritate the skin.  You should call the office for your biopsy report after 1 week if you have not already been contacted.  If you experience any problems, such as abnormal amounts of bleeding, swelling, significant bruising, significant pain, or evidence of infection, please call the office immediately.  FOR ADULT SURGERY PATIENTS: If you need something for pain relief you may take 1 extra strength Tylenol (acetaminophen) AND 2 Ibuprofen (200mg each) together every 4 hours as needed for pain. (do not take these if you are allergic to them or if you have a reason you should not take them.) Typically, you may only need pain medication for 1 to 3 days.   If you have any questions or concerns for your doctor, please call our main line at [phone] and press option 4 to reach your doctor's medical assistant. If no one answers, please leave a voicemail as directed and we will return your call as soon as possible. Messages left after 4 pm will be answered the following business day.   You may also send us a message via MyChart. We typically respond to MyChart messages within 1-2 business days.  For prescription refills, please ask your pharmacy to contact our office. Our fax number is [phone].  If you have an urgent issue when the clinic is closed that  cannot wait until the next business day, you can page your doctor at the number below.    Please note that while we do our best to be available for urgent issues outside of office hours, we are not available 24/7.   If you have an urgent issue and are unable to reach us, you may choose to seek medical care at your doctor's office, retail clinic, urgent care center, or emergency room.  If you have a medical emergency, please immediately call 911 or go to the emergency department.  Pager Numbers  - Dr. Kowalski: [phone]  - Dr. Moye: [phone]  - Dr. Stewart: [phone]  In the event of inclement weather, please call our main line at [phone] for an update on the status of any delays or closures.  Dermatology Medication Tips: Please keep the boxes that topical medications come in in order to help keep track of the instructions about where and how to use these. Pharmacies typically print the medication instructions only on the boxes and not directly on the medication tubes.   If your medication is too expensive, please contact our office at [phone] option 4 or send us a message through MyChart.   We are unable to tell what your co-pay for medications will be in advance as this is different depending on your insurance coverage. However, we may be able to find a substitute medication at lower cost or fill out paperwork to get insurance to cover a needed   medication.   If a prior authorization is required to get your medication covered by your insurance company, please allow us 1-2 business days to complete this process.  Drug prices often vary depending on where the prescription is filled and some pharmacies may offer cheaper prices.  The website www.goodrx.com contains coupons for medications through different pharmacies. The prices here do not account for what the cost may be with help from insurance (it may be cheaper with your insurance), but the website can give you the  price if you did not use any insurance.  - You can print the associated coupon and take it with your prescription to the pharmacy.  - You may also stop by our office during regular business hours and pick up a GoodRx coupon card.  - If you need your prescription sent electronically to a different pharmacy, notify our office through [place] MyChart or by phone at [phone] option 4.   

## 2021-09-20 NOTE — Progress Notes (Signed)
Follow-Up Visit   Subjective  Kayla Jensen is a 70 y.o. female who presents for the following: Lipomas  (X 2, R forearm, pt presents for excision).  The following portions of the chart were reviewed this encounter and updated as appropriate:   Tobacco  Allergies  Meds  Problems  Med Hx  Surg Hx  Fam Hx     Review of Systems:  No other skin or systemic complaints except as noted in HPI or Assessment and Plan.  Objective  Well appearing patient in no apparent distress; mood and affect are within normal limits.  A focused examination was performed including right arm. Relevant physical exam findings are noted in the Assessment and Plan.  Right Forearm proximal Rubbery nodule 2.1cm  Right Forearm distal Rubbery nodule 2.5cm   Assessment & Plan  Neoplasm of skin (2) Right Forearm proximal  Skin excision  Lesion length (cm):  2.1 Lesion width (cm):  2.1 Margin per side (cm):  0 Total excision diameter (cm):  2.1 Informed consent: discussed and consent obtained   Timeout: patient name, date of birth, surgical site, and procedure verified   Procedure prep:  Patient was prepped and draped in usual sterile fashion Prep type:  Isopropyl alcohol (Puracyn) Anesthesia: the lesion was anesthetized in a standard fashion   Anesthetic:  1% lidocaine w/ epinephrine 1-100,000 buffered w/ 8.4% NaHCO3 (3cc) Instrument used comment:  #15c blade Hemostasis achieved with: pressure   Hemostasis achieved with comment:  Electrocautery Outcome: patient tolerated procedure well with no complications   Post-procedure details: sterile dressing applied and wound care instructions given   Dressing type: bandage and pressure dressing (Mupirocin)    Skin repair Complexity:  Complex Final length (cm):  1.5 Reason for type of repair: reduce tension to allow closure, reduce the risk of dehiscence, infection, and necrosis, reduce subcutaneous dead space and avoid a hematoma, allow closure of the  large defect, preserve normal anatomy, preserve normal anatomical and functional relationships and enhance both functionality and cosmetic results   Undermining: area extensively undermined   Undermining comment:  Undermining Defect 2.1cm Subcutaneous layers (deep stitches):  Suture size:  4-0 Suture type: Vicryl (polyglactin 910)   Subcutaneous suture technique: Inverted Dermal. Fine/surface layer approximation (top stitches):  Suture size:  4-0 Suture type: nylon   Stitches: horizontal mattress   Suture removal (days):  7 Hemostasis achieved with: pressure Outcome: patient tolerated procedure well with no complications   Post-procedure details: sterile dressing applied and wound care instructions given   Dressing type: bandage, pressure dressing and bacitracin (Mupirocin)    mupirocin ointment (BACTROBAN) 2 % Apply 1 application topically daily. Qd to excision site  Specimen 1 - Surgical pathology Differential Diagnosis: D48.5 Lipoma vs other  Check Margins: No Rubbery nodule 2.1cm  Right Forearm distal  Skin excision  Lesion length (cm):  2.5 Lesion width (cm):  2.5 Margin per side (cm):  0 Total excision diameter (cm):  2.5 Informed consent: discussed and consent obtained   Timeout: patient name, date of birth, surgical site, and procedure verified   Procedure prep:  Patient was prepped and draped in usual sterile fashion Prep type:  Isopropyl alcohol (Puracyn) Anesthesia: the lesion was anesthetized in a standard fashion   Anesthetic:  1% lidocaine w/ epinephrine 1-100,000 buffered w/ 8.4% NaHCO3 (3.0cc) Instrument used: #15 blade   Instrument used comment:  #15c blade Hemostasis achieved with: pressure   Hemostasis achieved with comment:  Electrocautery Outcome: patient tolerated procedure well with no  complications   Post-procedure details: sterile dressing applied and wound care instructions given   Dressing type: bandage and pressure dressing (Mupirocin)     Skin repair Complexity:  Complex Final length (cm):  1.5 Reason for type of repair: reduce tension to allow closure, reduce the risk of dehiscence, infection, and necrosis, reduce subcutaneous dead space and avoid a hematoma, allow closure of the large defect, preserve normal anatomy, preserve normal anatomical and functional relationships and enhance both functionality and cosmetic results   Undermining: area extensively undermined   Undermining comment:  Undermining Defect 2.5cm Subcutaneous layers (deep stitches):  Suture size:  4-0 Suture type: Vicryl (polyglactin 910)   Subcutaneous suture technique: Inverted Dermal. Fine/surface layer approximation (top stitches):  Suture size:  4-0 Suture type: nylon   Stitches: horizontal mattress   Suture removal (days):  7 Hemostasis achieved with: pressure Outcome: patient tolerated procedure well with no complications   Post-procedure details: sterile dressing applied and wound care instructions given   Dressing type: bandage, pressure dressing and bacitracin (Mupirocin)    Specimen 2 - Surgical pathology Differential Diagnosis: D48.5 Lipoma vs other  Check Margins: No Rubbery nodule 2.5cm  Lipomas vs other x 2  Start Mupirocin oint qd to excision sites  Return in about 1 week (around 09/27/2021) for suture removal.  I, Sonya Hupman, RMA, am acting as scribe for Sarina Ser, MD . Documentation: I have reviewed the above documentation for accuracy and completeness, and I agree with the above.  Sarina Ser, MD

## 2021-09-21 ENCOUNTER — Telehealth: Payer: Self-pay

## 2021-09-21 NOTE — Telephone Encounter (Signed)
Patient doing well after yesterdays surgery./sh 

## 2021-09-22 ENCOUNTER — Encounter: Payer: Self-pay | Admitting: Dermatology

## 2021-09-27 ENCOUNTER — Other Ambulatory Visit: Payer: Self-pay

## 2021-09-27 ENCOUNTER — Encounter: Payer: Self-pay | Admitting: Dermatology

## 2021-09-27 ENCOUNTER — Ambulatory Visit (INDEPENDENT_AMBULATORY_CARE_PROVIDER_SITE_OTHER): Payer: Medicare HMO | Admitting: Dermatology

## 2021-09-27 DIAGNOSIS — D1721 Benign lipomatous neoplasm of skin and subcutaneous tissue of right arm: Secondary | ICD-10-CM

## 2021-09-27 DIAGNOSIS — Z4802 Encounter for removal of sutures: Secondary | ICD-10-CM

## 2021-09-27 NOTE — Patient Instructions (Signed)

## 2021-09-27 NOTE — Progress Notes (Signed)
   Follow-Up Visit   Subjective  Kayla Jensen is a 70 y.o. female who presents for the following: Lipomas bx proven (R forearm proximal, R forearm distal, pt presents for suture removal).  The following portions of the chart were reviewed this encounter and updated as appropriate:   Tobacco  Allergies  Meds  Problems  Med Hx  Surg Hx  Fam Hx     Review of Systems:  No other skin or systemic complaints except as noted in HPI or Assessment and Plan.  Objective  Well appearing patient in no apparent distress; mood and affect are within normal limits.  A focused examination was performed including right arm. Relevant physical exam findings are noted in the Assessment and Plan.  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  - Discussed pathology results showing Lipoma for both  - Patient advised to keep steri-strips dry until they fall off. - Scars remodel for a full year. - Once steri-strips fall off, patient can apply over-the-counter silicone scar cream each night to help with scar remodeling if desired. - Patient advised to call with any concerns or if they notice any new or changing lesions.   Return for as scheduled for TBSE.  I, Othelia Pulling, RMA, am acting as scribe for Sarina Ser, MD . Documentation: I have reviewed the above documentation for accuracy and completeness, and I agree with the above.  Sarina Ser, MD

## 2022-02-13 ENCOUNTER — Ambulatory Visit (INDEPENDENT_AMBULATORY_CARE_PROVIDER_SITE_OTHER): Payer: Medicare HMO | Admitting: Dermatology

## 2022-02-13 ENCOUNTER — Other Ambulatory Visit: Payer: Self-pay

## 2022-02-13 DIAGNOSIS — B029 Zoster without complications: Secondary | ICD-10-CM | POA: Diagnosis not present

## 2022-02-13 MED ORDER — MOMETASONE FUROATE 0.1 % EX CREA: 1.0000 "application " | TOPICAL_CREAM | CUTANEOUS | 1 refills | Status: DC

## 2022-02-13 MED ORDER — VALACYCLOVIR HCL 1 G PO TABS: 1000.0000 mg | ORAL_TABLET | Freq: Three times a day (TID) | ORAL | 0 refills | Status: AC

## 2022-02-13 NOTE — Patient Instructions (Signed)

## 2022-02-13 NOTE — Progress Notes (Signed)
° °  Follow-Up Visit   Subjective  Kayla Jensen is a 71 y.o. female who presents for the following: rash (Lower abdomen, started Saturday, itchy, ). Spreading.  Not been outdoors, not around animals.  She has had shingles on L hand/arm before and looks similar.  She has shingles vaccine 12 yrs ago.   The following portions of the chart were reviewed this encounter and updated as appropriate:       Review of Systems:  No other skin or systemic complaints except as noted in HPI or Assessment and Plan.  Objective  Well appearing patient in no apparent distress; mood and affect are within normal limits.  A focused examination was performed including abdomen. Relevant physical exam findings are noted in the Assessment and Plan.  R lower abdomen, R flank Pink patches with small vesicles R lower abdomen and flank    Assessment & Plan  Herpes zoster without complication R lower abdomen, R flank  Discussed viral etiology (herpes zoster) and risk of contagion. Before blisters dry up and heal, shingles rash can cause chickenpox in people who have never had it or have never been vaccinated against it, if they are exposed to the virus.  Keep area covered.  Avoid contact with pregnant women. Shingles can also cause significant pain or unusual skin sensations (post-herpetic neuralgia) which may persist after rash has resolved.  Shingles rash may leave dyspigmented scars on skin once healed.  Start Valtrex 1gm tid x 7 days #21, 0rf Start Mometasone cr qd/bid until clear   Topical steroids (such as triamcinolone, fluocinolone, fluocinonide, mometasone, clobetasol, halobetasol, betamethasone, hydrocortisone) can cause thinning and lightening of the skin if they are used for too long in the same area. Your physician has selected the right strength medicine for your problem and area affected on the body. Please use your medication only as directed by your physician to prevent side effects.      valACYclovir (VALTREX) 1000 MG tablet - R lower abdomen, R flank Take 1 tablet (1,000 mg total) by mouth 3 (three) times daily for 7 days.  mometasone (ELOCON) 0.1 % cream - R lower abdomen, R flank Apply 1 application topically as directed. Qd/bid aa itchy rash on abdomen until clear   Return if symptoms worsen or fail to improve.  I, Othelia Pulling, RMA, am acting as scribe for Brendolyn Patty, MD .  Documentation: I have reviewed the above documentation for accuracy and completeness, and I agree with the above.  Brendolyn Patty MD

## 2022-02-20 ENCOUNTER — Telehealth: Payer: Self-pay

## 2022-02-20 NOTE — Telephone Encounter (Signed)
Pt called requesting a refill of Mometasone cream for shingles, she has a half of a tube remaining, discussed with pt only use the cream for itchy areas on her skin, do not over use this cream, she should use this cream sparingly,

## 2022-05-17 ENCOUNTER — Ambulatory Visit (INDEPENDENT_AMBULATORY_CARE_PROVIDER_SITE_OTHER): Payer: Medicare HMO | Admitting: Dermatology

## 2022-05-17 DIAGNOSIS — D2371 Other benign neoplasm of skin of right lower limb, including hip: Secondary | ICD-10-CM

## 2022-05-17 DIAGNOSIS — L814 Other melanin hyperpigmentation: Secondary | ICD-10-CM

## 2022-05-17 DIAGNOSIS — Z1283 Encounter for screening for malignant neoplasm of skin: Secondary | ICD-10-CM | POA: Diagnosis not present

## 2022-05-17 DIAGNOSIS — L578 Other skin changes due to chronic exposure to nonionizing radiation: Secondary | ICD-10-CM | POA: Diagnosis not present

## 2022-05-17 DIAGNOSIS — D1800 Hemangioma unspecified site: Secondary | ICD-10-CM

## 2022-05-17 DIAGNOSIS — L719 Rosacea, unspecified: Secondary | ICD-10-CM | POA: Diagnosis not present

## 2022-05-17 DIAGNOSIS — Z79899 Other long term (current) drug therapy: Secondary | ICD-10-CM | POA: Diagnosis not present

## 2022-05-17 DIAGNOSIS — D492 Neoplasm of unspecified behavior of bone, soft tissue, and skin: Secondary | ICD-10-CM

## 2022-05-17 DIAGNOSIS — D229 Melanocytic nevi, unspecified: Secondary | ICD-10-CM

## 2022-05-17 DIAGNOSIS — L820 Inflamed seborrheic keratosis: Secondary | ICD-10-CM | POA: Diagnosis not present

## 2022-05-17 DIAGNOSIS — L821 Other seborrheic keratosis: Secondary | ICD-10-CM

## 2022-05-17 DIAGNOSIS — Z8619 Personal history of other infectious and parasitic diseases: Secondary | ICD-10-CM

## 2022-05-17 MED ORDER — METRONIDAZOLE 1 % EX GEL: Freq: Every day | CUTANEOUS | 11 refills | Status: AC

## 2022-05-17 MED ORDER — DOXYCYCLINE HYCLATE 20 MG PO TABS: 20.0000 mg | ORAL_TABLET | Freq: Two times a day (BID) | ORAL | 4 refills | Status: DC

## 2022-05-17 NOTE — Patient Instructions (Addendum)
Wound Care Instructions  Cleanse wound gently with soap and water once a day then pat dry with clean gauze. Apply a thing coat of Petrolatum (petroleum jelly, "Vaseline") over the wound (unless you have an allergy to this). We recommend that you use a new, sterile tube of Vaseline. Do not pick or remove scabs. Do not remove the yellow or white "healing tissue" from the base of the wound.  Cover the wound with fresh, clean, nonstick gauze and secure with paper tape. You may use Band-Aids in place of gauze and tape if the would is small enough, but would recommend trimming much of the tape off as there is often too much. Sometimes Band-Aids can irritate the skin.  You should call the office for your biopsy report after 1 week if you have not already been contacted.  If you experience any problems, such as abnormal amounts of bleeding, swelling, significant bruising, significant pain, or evidence of infection, please call the office immediately.  FOR ADULT SURGERY PATIENTS: If you need something for pain relief you may take 1 extra strength Tylenol (acetaminophen) AND 2 Ibuprofen ('200mg'$  each) together every 4 hours as needed for pain. (do not take these if you are allergic to them or if you have a reason you should not take them.) Typically, you may only need pain medication for 1 to 3 days.    Doxycycline should be taken with food to prevent nausea. Do not lay down for 30 minutes after taking. Be cautious with sun exposure and use good sun protection while on this medication. Pregnant women should not take this medication.      Cryotherapy Aftercare  Wash gently with soap and water everyday.   Apply Vaseline and Band-Aid daily until healed.     If You Need Anything After Your Visit  If you have any questions or concerns for your doctor, please call our main line at [phone] and press option 4 to reach your doctor's medical assistant. If no one answers, please leave a voicemail as directed  and we will return your call as soon as possible. Messages left after 4 pm will be answered the following business day.   You may also send [place] a message via [place]. We typically respond to MyChart messages within 1-2 business days.  For prescription refills, please ask your pharmacy to contact our office. Our fax number is [phone].  If you have an urgent issue when the clinic is closed that cannot wait until the next business day, you can page your doctor at the number below.    Please note that while we do our best to be available for urgent issues outside of office hours, we are not available 24/7.   If you have an urgent issue and are unable to reach [place], you may choose to seek medical care at your doctor's office, retail clinic, urgent care center, or emergency room.  If you have a medical emergency, please immediately call 911 or go to the emergency department.  Pager Numbers  - Dr. Nehemiah Massed: [phone]  - Dr. Laurence Ferrari: [phone]  - Dr. Nicole Kindred: [phone]  In the event of inclement weather, please call our main line at [phone] for an update on the status of any delays or closures.  Dermatology Medication Tips: Please keep the boxes that topical medications come in in order to help keep track of the instructions about where and how to use these. Pharmacies typically print the medication instructions only on the boxes and not directly  on the medication tubes.   If your medication is too expensive, please contact our office at [phone] option 4 or send [place] a message through [place].   We are unable to tell what your co-pay for medications will be in advance as this is different depending on your insurance coverage. However, we may be able to find a substitute medication at lower cost or fill out paperwork to get insurance to cover a needed medication.   If a prior authorization is required to get your medication covered by your insurance company, please allow [place] 1-2  business days to complete this process.  Drug prices often vary depending on where the prescription is filled and some pharmacies may offer cheaper prices.  The website www.goodrx.com contains coupons for medications through different pharmacies. The prices here do not account for what the cost may be with help from insurance (it may be cheaper with your insurance), but the website can give you the price if you did not use any insurance.  - You can print the associated coupon and take it with your prescription to the pharmacy.  - You may also stop by our office during regular business hours and pick up a GoodRx coupon card.  - If you need your prescription sent electronically to a different pharmacy, notify our office through [facility] or by phone at [phone] option 4.     Si Usted Necesita Algo Despus de Su Visita  Tambin puede enviarnos un mensaje a travs de Pharmacist, community. Por lo general respondemos a los mensajes de MyChart en el transcurso de 1 a 2 das hbiles.  Para renovar recetas, por favor pida a su farmacia que se ponga en contacto con nuestra oficina. Harland Dingwall de fax es [place] [phone].  Si tiene un asunto urgente cuando la clnica est cerrada y que no puede esperar hasta el siguiente da hbil, puede llamar/localizar a su doctor(a) al nmero que aparece a continuacin.   Por favor, tenga en cuenta que aunque hacemos todo lo posible para estar disponibles para asuntos urgentes fuera del horario de [place], no estamos disponibles las 24 horas del da, los 7 das de la [place].   Si tiene un problema urgente y no puede comunicarse con nosotros, puede optar por buscar atencin mdica  en el consultorio de su doctor(a), en una clnica privada, en un centro de atencin urgente o en una sala de emergencias.  Si tiene Engineering geologist, por favor llame inmediatamente al 911 o vaya a la sala de emergencias.  Nmeros de bper  - Dr. Nehemiah Massed: [phone]  - Dra.  Moye: [phone]  - Dra. Nicole Kindred: [phone]  En caso de inclemencias del [place], por favor llame a Johnsie Kindred principal al [phone] para una actualizacin sobre el [place] de cualquier retraso o cierre.  Consejos para la medicacin en dermatologa: Por favor, guarde las cajas en las que vienen los medicamentos de uso tpico para ayudarle a seguir las instrucciones sobre dnde y cmo usarlos. Las farmacias generalmente imprimen las instrucciones del medicamento slo en las cajas y no directamente en los tubos del [place].   Si su medicamento es muy caro, por favor, pngase en contacto con Zigmund Daniel llamando al [phone] y presione la opcin 4 o envenos un mensaje a travs de Pharmacist, community.   No podemos decirle cul ser su copago por los medicamentos por adelantado ya que esto es diferente dependiendo de la cobertura de su seguro. Sin embargo, es posible que podamos encontrar un medicamento sustituto  a Electrical engineer un formulario para que el seguro cubra el medicamento que se considera necesario.   Si se requiere una autorizacin previa para que su compaa de seguros [place] su medicamento, por favor permtanos de 1 a 2 das hbiles para completar este proceso.  Los precios de los medicamentos varan con frecuencia dependiendo del Environmental consultant de dnde se surte la receta y alguna farmacias pueden ofrecer precios ms baratos.  El sitio web www.goodrx.com tiene cupones para medicamentos de Airline pilot. Los precios aqu no tienen en cuenta lo que podra costar con la ayuda del seguro (puede ser ms barato con su seguro), pero el sitio web puede darle el precio si no utiliz Research scientist (physical sciences).  - Puede imprimir el cupn correspondiente y llevarlo con su receta a la farmacia.  - Tambin puede pasar por nuestra oficina durante el horario de atencin regular y Charity fundraiser una tarjeta de cupones de GoodRx.  - Si necesita que su receta se enve electrnicamente a una farmacia diferente,  informe a nuestra oficina a travs de MyChart de [place] o por telfono llamando al [phone] y presione la opcin 4.

## 2022-05-17 NOTE — Progress Notes (Signed)
Follow-Up Visit   Subjective  Kayla Jensen is a 71 y.o. female who presents for the following: Total body skin exam, check spots (R leg, >1 yr, hits with shaving/Chest, 2wks, tender to touch), Rosacea (Face, 1 yr f/u, Metrogel 1% prn), and hx of shingles (Pt hx of shingles 01/2022, she had shingles vaccine 2011 and is wondering when she can get the Shingrix vaccine.). The patient presents for Total-Body Skin Exam (TBSE) for skin cancer screening and mole check.  The patient has spots, moles and lesions to be evaluated, some may be new or changing and the patient has concerns that these could be cancer.   The following portions of the chart were reviewed this encounter and updated as appropriate:   Tobacco  Allergies  Meds  Problems  Med Hx  Surg Hx  Fam Hx     Review of Systems:  No other skin or systemic complaints except as noted in HPI or Assessment and Plan.  Objective  Well appearing patient in no apparent distress; mood and affect are within normal limits.  A full examination was performed including scalp, head, eyes, ears, nose, lips, neck, chest, axillae, abdomen, back, buttocks, bilateral upper extremities, bilateral lower extremities, hands, feet, fingers, toes, fingernails, and toenails. All findings within normal limits unless otherwise noted below.  face Erythema face  R calf Firm pap 1.2cm     R mid sternum x1 0.4cm flesh colored pap   Assessment & Plan   Lentigines - Scattered tan macules - Due to sun exposure - Benign-appearing, observe - Recommend daily broad spectrum sunscreen SPF 30+ to sun-exposed areas, reapply every 2 hours as needed. - Call for any changes  Seborrheic Keratoses - Stuck-on, waxy, tan-brown papules and/or plaques  - Benign-appearing - Discussed benign etiology and prognosis. - Observe - Call for any changes  Melanocytic Nevi - Tan-brown and/or pink-flesh-colored symmetric macules and papules - Benign appearing on exam  today - Observation - Call clinic for new or changing moles - Recommend daily use of broad spectrum spf 30+ sunscreen to sun-exposed areas.   Hemangiomas - Red papules - Discussed benign nature - Observe - Call for any changes  Actinic Damage - Chronic condition, secondary to cumulative UV/sun exposure - diffuse scaly erythematous macules with underlying dyspigmentation - Recommend daily broad spectrum sunscreen SPF 30+ to sun-exposed areas, reapply every 2 hours as needed.  - Staying in the shade or wearing long sleeves, sun glasses (UVA+UVB protection) and wide brim hats (4-inch brim around the entire circumference of the hat) are also recommended for sun protection.  - Call for new or changing lesions.  Skin cancer screening performed today.   Rosacea face Rosacea is a chronic progressive skin condition usually affecting the face of adults, causing redness and/or acne bumps. It is treatable but not curable. It sometimes affects the eyes (ocular rosacea) as well. It may respond to topical and/or systemic medication and can flare with stress, sun exposure, alcohol, exercise and some foods.  Daily application of broad spectrum spf 30+ sunscreen to face is recommended to reduce flares. Chronic and persistent condition with duration or expected duration over one year. Condition is symptomatic / bothersome to patient. Not to goal.  Cont Metrogel 1% qhs Cont Doxycycline '20mg'$  1 po bid with food and drink  Long term medication management.  Patient is using long term (months to years) prescription medication  to control their dermatologic condition.  These medications require periodic monitoring to evaluate for efficacy and  side effects and may require periodic laboratory monitoring.  Doxycycline should be taken with food to prevent nausea. Do not lay down for 30 minutes after taking. Be cautious with sun exposure and use good sun protection while on this medication. Pregnant women should not  take this medication.    metroNIDAZOLE (METROGEL) 1 % gel - face Apply topically daily. Qhs to face for rosacea  doxycycline (PERIOSTAT) 20 MG tablet - face Take 1 tablet (20 mg total) by mouth 2 (two) times daily. Take with food and drink  Neoplasm of skin R calf Epidermal / dermal shaving  Lesion diameter (cm):  1.2 Informed consent: discussed and consent obtained   Timeout: patient name, date of birth, surgical site, and procedure verified   Procedure prep:  Patient was prepped and draped in usual sterile fashion Prep type:  Isopropyl alcohol Anesthesia: the lesion was anesthetized in a standard fashion   Anesthetic:  1% lidocaine w/ epinephrine 1-100,000 buffered w/ 8.4% NaHCO3 Instrument used: flexible razor blade   Hemostasis achieved with: pressure, aluminum chloride and electrodesiccation   Outcome: patient tolerated procedure well   Post-procedure details: sterile dressing applied and wound care instructions given   Dressing type: bandage and petrolatum    Specimen 1 - Surgical pathology Differential Diagnosis: D48.5 Traumatized Dermatofibroma r/o CA Check Margins: No Firm pap 1.2cm  Related Medications mupirocin ointment (BACTROBAN) 2 % Apply 1 application topically daily. Qd to excision site  Inflamed seborrheic keratosis R mid sternum x1 New changing ISK vs other, LN2 today.  May consider biopsy if persists in the future. Symptomatic, irritating, patient would like treated.  Destruction of lesion - R mid sternum x1 Complexity: simple   Destruction method: cryotherapy   Informed consent: discussed and consent obtained   Timeout:  patient name, date of birth, surgical site, and procedure verified Lesion destroyed using liquid nitrogen: Yes   Region frozen until ice ball extended beyond lesion: Yes   Outcome: patient tolerated procedure well with no complications   Post-procedure details: wound care instructions given    Skin cancer screening  Hx of  Shingles 01/2022 - Discussed with pt she should consider waiting on Shingrix vaccine for 6 months to 3 years from shingles outbreak.  Return in about 1 year (around 05/18/2023) for TBSE.  I, Othelia Pulling, RMA, am acting as scribe for Sarina Ser, MD . Documentation: I have reviewed the above documentation for accuracy and completeness, and I agree with the above.  Sarina Ser, MD

## 2022-05-19 ENCOUNTER — Other Ambulatory Visit: Payer: Self-pay | Admitting: Internal Medicine

## 2022-05-19 DIAGNOSIS — Z1231 Encounter for screening mammogram for malignant neoplasm of breast: Secondary | ICD-10-CM

## 2022-05-22 ENCOUNTER — Encounter: Payer: Self-pay | Admitting: Dermatology

## 2022-05-23 ENCOUNTER — Telehealth: Payer: Self-pay

## 2022-05-23 NOTE — Telephone Encounter (Signed)
-----   Message from Ralene Bathe, MD sent at 05/18/2022  4:38 PM EDT ----- Diagnosis Skin , right calf DERMATOFIBROMA, BASE INVOLVED  Benign dermatofibroma May persist or recur No further treatment at this time

## 2022-05-23 NOTE — Telephone Encounter (Signed)
Advised pt of bx result/sh ?

## 2022-09-18 ENCOUNTER — Ambulatory Visit
Admission: RE | Admit: 2022-09-18 | Discharge: 2022-09-18 | Disposition: A | Discharge status: Home / Self Care | Payer: Medicare HMO | Source: Ambulatory Visit | Attending: Internal Medicine | Admitting: Internal Medicine

## 2022-09-18 DIAGNOSIS — Z1231 Encounter for screening mammogram for malignant neoplasm of breast: Secondary | ICD-10-CM | POA: Insufficient documentation

## 2022-09-19 ENCOUNTER — Encounter: Payer: Self-pay | Admitting: Obstetrics & Gynecology

## 2022-09-19 ENCOUNTER — Ambulatory Visit (INDEPENDENT_AMBULATORY_CARE_PROVIDER_SITE_OTHER): Payer: Medicare HMO | Admitting: Obstetrics & Gynecology

## 2022-09-19 VITALS — BP 112/62 | HR 98 | Resp 15 | Ht 62.5 in | Wt 135.2 lb

## 2022-09-19 DIAGNOSIS — Z780 Asymptomatic menopausal state: Secondary | ICD-10-CM

## 2022-09-19 MED ORDER — ESTROGENS CONJUGATED 0.625 MG PO TABS: 0.6250 mg | ORAL_TABLET | Freq: Every day | ORAL | 3 refills | Status: DC

## 2022-09-19 NOTE — Progress Notes (Signed)
/*  Subjective:     Kayla Jensen is a 71 y.o. female here for a routine exam.  Current complaints: none.  Personal health questionnaire reviewed: yes.  Currently on Premarin 0.'9mg'$  daily.  Will decrease dose to 0.625 mg Gynecologic History No LMP recorded. Patient is postmenopausal. Contraception: abstinence Last Pap: 09/09/2018. Results were: normal Last mammogram: 09/19/2022. Results were: normal  Obstetric History OB History  Gravida Para Term Preterm AB Living  '1 1 1     1  '$ SAB IAB Ectopic Multiple Live Births               # Outcome Date GA Lbr Len/2nd Weight Sex Delivery Anes PTL Lv  1 Term [birth year]    M Vag-Spont        The following portions of the patient's history were reviewed and updated as appropriate: allergies, current medications, past family history, past medical history, past social history, past surgical history, and problem list.  Review of Systems A comprehensive review of systems was negative.    Objective:    BP 112/62   Pulse 98   Resp 15   Ht 5' 2.5" (1.588 m)   Wt 135 lb 3.2 oz (61.3 kg)   SpO2 99%   BMI 24.33 kg/m  General appearance: alert, cooperative, and no distress Breasts: normal appearance, no masses or tenderness Abdomen: normal findings: no organomegaly and soft, non-tender Pelvic: cervix normal in appearance, external genitalia normal, no adnexal masses or tenderness, no cervical motion tenderness, uterus normal size, shape, and consistency, and vagina normal without discharge Extremities: extremities normal, atraumatic, no cyanosis or edema Skin: Skin color, texture, turgor normal. No rashes or lesions Neurologic: Grossly normal    Assessment:  Decreasing Premarin to 0.625 mg starting January 2024    Plan:  Decreasing dose of Premarin  Follow up in: 1 year. And prn Rosario Adie, MD  09/19/2022 2:07 PM

## 2023-02-23 HISTORY — PX: OTHER SURGICAL HISTORY: SHX169

## 2023-05-23 ENCOUNTER — Ambulatory Visit (INDEPENDENT_AMBULATORY_CARE_PROVIDER_SITE_OTHER): Payer: Medicare HMO | Admitting: Dermatology

## 2023-05-23 VITALS — BP 112/65 | HR 96

## 2023-05-23 DIAGNOSIS — W908XXA Exposure to other nonionizing radiation, initial encounter: Secondary | ICD-10-CM

## 2023-05-23 DIAGNOSIS — Z1283 Encounter for screening for malignant neoplasm of skin: Secondary | ICD-10-CM | POA: Diagnosis not present

## 2023-05-23 DIAGNOSIS — L814 Other melanin hyperpigmentation: Secondary | ICD-10-CM

## 2023-05-23 DIAGNOSIS — L578 Other skin changes due to chronic exposure to nonionizing radiation: Secondary | ICD-10-CM | POA: Diagnosis not present

## 2023-05-23 DIAGNOSIS — X32XXXA Exposure to sunlight, initial encounter: Secondary | ICD-10-CM | POA: Diagnosis not present

## 2023-05-23 DIAGNOSIS — D229 Melanocytic nevi, unspecified: Secondary | ICD-10-CM

## 2023-05-23 DIAGNOSIS — D692 Other nonthrombocytopenic purpura: Secondary | ICD-10-CM

## 2023-05-23 DIAGNOSIS — Z86018 Personal history of other benign neoplasm: Secondary | ICD-10-CM

## 2023-05-23 DIAGNOSIS — L821 Other seborrheic keratosis: Secondary | ICD-10-CM

## 2023-05-23 DIAGNOSIS — D1801 Hemangioma of skin and subcutaneous tissue: Secondary | ICD-10-CM

## 2023-05-23 NOTE — Patient Instructions (Signed)
Due to recent changes in healthcare laws, you may see results of your pathology and/or laboratory studies on MyChart before the doctors have had a chance to review them. We understand that in some cases there may be results that are confusing or concerning to you. Please understand that not all results are received at the same time and often the doctors may need to interpret multiple results in order to provide you with the best plan of care or course of treatment. Therefore, we ask that you please give us 2 business days to thoroughly review all your results before contacting the office for clarification. Should we see a critical lab result, you will be contacted sooner.   If You Need Anything After Your Visit  If you have any questions or concerns for your doctor, please call our main line at [phone] and press option 4 to reach your doctor's medical assistant. If no one answers, please leave a voicemail as directed and we will return your call as soon as possible. Messages left after 4 pm will be answered the following business day.   You may also send us a message via MyChart. We typically respond to MyChart messages within 1-2 business days.  For prescription refills, please ask your pharmacy to contact our office. Our fax number is [phone].  If you have an urgent issue when the clinic is closed that cannot wait until the next business day, you can page your doctor at the number below.    Please note that while we do our best to be available for urgent issues outside of office hours, we are not available 24/7.   If you have an urgent issue and are unable to reach us, you may choose to seek medical care at your doctor's office, retail clinic, urgent care center, or emergency room.  If you have a medical emergency, please immediately call 911 or go to the emergency department.  Pager Numbers  - Dr. Kowalski: [phone]  - Dr. Moye: [phone]  - Dr. Stewart:  [phone]  In the event of inclement weather, please call our main line at [phone] for an update on the status of any delays or closures.  Dermatology Medication Tips: Please keep the boxes that topical medications come in in order to help keep track of the instructions about where and how to use these. Pharmacies typically print the medication instructions only on the boxes and not directly on the medication tubes.   If your medication is too expensive, please contact our office at [phone] option 4 or send us a message through MyChart.   We are unable to tell what your co-pay for medications will be in advance as this is different depending on your insurance coverage. However, we may be able to find a substitute medication at lower cost or fill out paperwork to get insurance to cover a needed medication.   If a prior authorization is required to get your medication covered by your insurance company, please allow us 1-2 business days to complete this process.  Drug prices often vary depending on where the prescription is filled and some pharmacies may offer cheaper prices.  The website www.goodrx.com contains coupons for medications through different pharmacies. The prices here do not account for what the cost may be with help from insurance (it may be cheaper with your insurance), but the website can give you the price if you did not use any insurance.  - You can print the associated coupon and take it with   your prescription to the pharmacy.  - You may also stop by our office during regular business hours and pick up a GoodRx coupon card.  - If you need your prescription sent electronically to a different pharmacy, notify our office through [facility] MyChart or by phone at [phone] option 4.     Si Usted Necesita Algo Despus de Su Visita  Tambin puede enviarnos un mensaje a travs de MyChart. Por lo general respondemos a los mensajes de MyChart en el transcurso de 1 a 2  das hbiles.  Para renovar recetas, por favor pida a su farmacia que se ponga en contacto con nuestra oficina. Nuestro nmero de fax es el [phone].  Si tiene un asunto urgente cuando la clnica est cerrada y que no puede esperar hasta el siguiente da hbil, puede llamar/localizar a su doctor(a) al nmero que aparece a continuacin.   Por favor, tenga en cuenta que aunque hacemos todo lo posible para estar disponibles para asuntos urgentes fuera del horario de oficina, no estamos disponibles las 24 horas del da, los 7 das de la semana.   Si tiene un problema urgente y no puede comunicarse con nosotros, puede optar por buscar atencin mdica  en el consultorio de su doctor(a), en una clnica privada, en un centro de atencin urgente o en una sala de emergencias.  Si tiene una emergencia mdica, por favor llame inmediatamente al 911 o vaya a la sala de emergencias.  Nmeros de bper  - Dr. Kowalski: [phone]  - Dra. Moye: [phone]  - Dra. Stewart: [phone]  En caso de inclemencias del tiempo, por favor llame a nuestra lnea principal al [phone] para una actualizacin sobre el estado de cualquier retraso o cierre.  Consejos para la medicacin en dermatologa: Por favor, guarde las cajas en las que vienen los medicamentos de uso tpico para ayudarle a seguir las instrucciones sobre dnde y cmo usarlos. Las farmacias generalmente imprimen las instrucciones del medicamento slo en las cajas y no directamente en los tubos del medicamento.   Si su medicamento es muy caro, por favor, pngase en contacto con nuestra oficina llamando al [phone] y presione la opcin 4 o envenos un mensaje a travs de MyChart.   No podemos decirle cul ser su copago por los medicamentos por adelantado ya que esto es diferente dependiendo de la cobertura de su seguro. Sin embargo, es posible que podamos encontrar un medicamento sustituto a menor costo o llenar un formulario para que el  seguro cubra el medicamento que se considera necesario.   Si se requiere una autorizacin previa para que su compaa de seguros cubra su medicamento, por favor permtanos de 1 a 2 das hbiles para completar este proceso.  Los precios de los medicamentos varan con frecuencia dependiendo del lugar de dnde se surte la receta y alguna farmacias pueden ofrecer precios ms baratos.  El sitio web www.goodrx.com tiene cupones para medicamentos de diferentes farmacias. Los precios aqu no tienen en cuenta lo que podra costar con la ayuda del seguro (puede ser ms barato con su seguro), pero el sitio web puede darle el precio si no utiliz ningn seguro.  - Puede imprimir el cupn correspondiente y llevarlo con su receta a la farmacia.  - Tambin puede pasar por nuestra oficina durante el horario de atencin regular y recoger una tarjeta de cupones de GoodRx.  - Si necesita que su receta se enve electrnicamente a una farmacia diferente, informe a nuestra oficina a travs de MyChart de [facility]   o por telfono llamando al [phone] y presione la opcin 4.  

## 2023-05-23 NOTE — Progress Notes (Signed)
   Follow-Up Visit   Subjective  Kayla Jensen is a 72 y.o. female who presents for the following: Skin Cancer Screening and Full Body Skin Exam  The patient presents for Total-Body Skin Exam (TBSE) for skin cancer screening and mole check. The patient has spots, moles and lesions to be evaluated, some may be new or changing and the patient has concerns that these could be cancer.    The following portions of the chart were reviewed this encounter and updated as appropriate: medications, allergies, medical history  Review of Systems:  No other skin or systemic complaints except as noted in HPI or Assessment and Plan.  Objective  Well appearing patient in no apparent distress; mood and affect are within normal limits.  A full examination was performed including scalp, head, eyes, ears, nose, lips, neck, chest, axillae, abdomen, back, buttocks, bilateral upper extremities, bilateral lower extremities, hands, feet, fingers, toes, fingernails, and toenails. All findings within normal limits unless otherwise noted below.   Relevant physical exam findings are noted in the Assessment and Plan.    Assessment & Plan   LENTIGINES, SEBORRHEIC KERATOSES, HEMANGIOMAS - Benign normal skin lesions - Benign-appearing - Call for any changes  MELANOCYTIC NEVI - Tan-brown and/or pink-flesh-colored symmetric macules and papules - Benign appearing on exam today - Observation - Call clinic for new or changing moles - Recommend daily use of broad spectrum spf 30+ sunscreen to sun-exposed areas.   ACTINIC DAMAGE - Chronic condition, secondary to cumulative UV/sun exposure - diffuse scaly erythematous macules with underlying dyspigmentation - Recommend daily broad spectrum sunscreen SPF 30+ to sun-exposed areas, reapply every 2 hours as needed.  - Staying in the shade or wearing long sleeves, sun glasses (UVA+UVB protection) and wide brim hats (4-inch brim around the entire circumference of the hat)  are also recommended for sun protection.  - Call for new or changing lesions.  Purpura - Chronic; persistent and recurrent.  Treatable, but not curable. 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  Call if any new or changing lesions are noted between office visits    SKIN CANCER SCREENING PERFORMED TODAY.   Return in about 2 years (around 05/22/2025) for TBSE.  IAngelique Holm, CMA, am acting as scribe for Armida Sans, MD .   Documentation: I have reviewed the above documentation for accuracy and completeness, and I agree with the above.  Armida Sans, MD

## 2023-05-30 ENCOUNTER — Encounter: Payer: Self-pay | Admitting: Dermatology

## 2023-06-01 ENCOUNTER — Other Ambulatory Visit: Payer: Self-pay | Admitting: Internal Medicine

## 2023-06-01 DIAGNOSIS — Z1231 Encounter for screening mammogram for malignant neoplasm of breast: Secondary | ICD-10-CM

## 2023-08-06 ENCOUNTER — Other Ambulatory Visit: Payer: Self-pay

## 2023-08-06 DIAGNOSIS — L719 Rosacea, unspecified: Secondary | ICD-10-CM

## 2023-08-06 MED ORDER — DOXYCYCLINE HYCLATE 20 MG PO TABS
20.0000 mg | ORAL_TABLET | Freq: Two times a day (BID) | ORAL | 2 refills | Status: DC
Start: 2023-08-06 — End: 2023-09-04

## 2023-08-06 NOTE — Progress Notes (Signed)
Refill request from [facility]. Escripted

## 2023-08-23 ENCOUNTER — Ambulatory Visit (INDEPENDENT_AMBULATORY_CARE_PROVIDER_SITE_OTHER): Payer: Medicare HMO | Admitting: Dermatology

## 2023-08-23 ENCOUNTER — Encounter: Payer: Self-pay | Admitting: Dermatology

## 2023-08-23 VITALS — BP 133/75 | HR 110

## 2023-08-23 DIAGNOSIS — D485 Neoplasm of uncertain behavior of skin: Secondary | ICD-10-CM

## 2023-08-23 DIAGNOSIS — L820 Inflamed seborrheic keratosis: Secondary | ICD-10-CM | POA: Diagnosis not present

## 2023-08-23 DIAGNOSIS — D492 Neoplasm of unspecified behavior of bone, soft tissue, and skin: Secondary | ICD-10-CM | POA: Diagnosis not present

## 2023-08-23 NOTE — Patient Instructions (Signed)

## 2023-08-23 NOTE — Progress Notes (Signed)
   Follow-Up Visit   Subjective  Kayla Jensen is a 72 y.o. female who presents for the following: Irregular, irritated skin lesion, bras do rub lesion, and she does occasionally scratch it. Patient leaving to go to the beach tomorrow so she doesn't want it removed today, just checked.   The patient has spots, moles and lesions to be evaluated, some may be new or changing and the patient may have concern these could be cancer.   The following portions of the chart were reviewed this encounter and updated as appropriate: medications, allergies, medical history  Review of Systems:  No other skin or systemic complaints except as noted in HPI or Assessment and Plan.  Objective  Well appearing patient in no apparent distress; mood and affect are within normal limits.    A focused examination was performed of the following areas:   Relevant exam findings are noted in the Assessment and Plan.  L scapula Pink papule with central erosion and crusting 0.5 cm     L calf Erythematous stuck-on, waxy papule or plaque    Assessment & Plan   SEBORRHEIC KERATOSIS - Stuck-on, waxy, tan-brown papules and/or plaques  - Benign-appearing - Discussed benign etiology and prognosis. - Observe - Call for any changes   Neoplasm of uncertain behavior of skin L scapula  Bug bite vs traumatized angioma vs early SCC - plan bx at follow up if still present in 1 week  Inflamed seborrheic keratosis L calf  Symptomatic, irritating, patient would like treated. Plan to treat at follow up.      Return in about 1 week (around 08/30/2023) for possible biopsy and to treat ISK on L calf.  Maylene Roes, CMA, am acting as scribe for Elie Goody, MD .   Documentation: I have reviewed the above documentation for accuracy and completeness, and I agree with the above.  Elie Goody, MD

## 2023-08-30 ENCOUNTER — Encounter: Payer: Self-pay | Admitting: Dermatology

## 2023-08-30 ENCOUNTER — Ambulatory Visit (INDEPENDENT_AMBULATORY_CARE_PROVIDER_SITE_OTHER): Payer: Medicare HMO | Admitting: Dermatology

## 2023-08-30 DIAGNOSIS — L820 Inflamed seborrheic keratosis: Secondary | ICD-10-CM

## 2023-08-30 DIAGNOSIS — D1801 Hemangioma of skin and subcutaneous tissue: Secondary | ICD-10-CM | POA: Diagnosis not present

## 2023-08-30 DIAGNOSIS — D485 Neoplasm of uncertain behavior of skin: Secondary | ICD-10-CM

## 2023-08-30 NOTE — Progress Notes (Signed)
   Follow-Up Visit   Subjective  Kayla Jensen is a 72 y.o. female who presents for the following: treat ISK at left calf, possible biopsy at left scapula.  The patient has spots, moles and lesions to be evaluated, some may be new or changing and the patient may have concern these could be cancer.   The following portions of the chart were reviewed this encounter and updated as appropriate: medications, allergies, medical history  Review of Systems:  No other skin or systemic complaints except as noted in HPI or Assessment and Plan.  Objective  Well appearing patient in no apparent distress; mood and affect are within normal limits.   A focused examination was performed of the following areas: Back, legs  Relevant exam findings are noted in the Assessment and Plan.  left scapula 0.5 cm red vascular papule     left calf Erythematous stuck-on, waxy papule or plaque    Assessment & Plan     Neoplasm of uncertain behavior of skin left scapula  Epidermal / dermal shaving  Lesion diameter (cm):  0.5 Informed consent: discussed and consent obtained   Patient was prepped and draped in usual sterile fashion: area prepped with alcohol. Anesthesia: the lesion was anesthetized in a standard fashion   Anesthetic:  1% lidocaine w/ epinephrine 1-100,000 buffered w/ 8.4% NaHCO3 Instrument used: DermaBlade   Hemostasis achieved with: pressure, aluminum chloride and electrodesiccation   Outcome: patient tolerated procedure well   Post-procedure details: wound care instructions given   Post-procedure details comment:  Ointment and small bandage applied  Specimen 1 - Surgical pathology Differential Diagnosis: Angioma vs Other  Check Margins: No 0.5 cm red vascular papule  Inflamed seborrheic keratosis left calf  Symptomatic, irritating, patient would like treated.  Benign-appearing.  Call clinic for new or changing lesions.    Destruction of lesion - left  calf  Destruction method: cryotherapy   Informed consent: discussed and consent obtained   Lesion destroyed using liquid nitrogen: Yes   Cryotherapy cycles:  2 Outcome: patient tolerated procedure well with no complications   Post-procedure details: wound care instructions given      Return for TBSE, with Dr. Kirtland Bouchard, as scheduled.  Anise Salvo, RMA, am acting as scribe for Elie Goody, MD .   Documentation: I have reviewed the above documentation for accuracy and completeness, and I agree with the above.  Elie Goody, MD

## 2023-08-30 NOTE — Patient Instructions (Addendum)

## 2023-09-04 ENCOUNTER — Other Ambulatory Visit: Payer: Self-pay

## 2023-09-04 DIAGNOSIS — L719 Rosacea, unspecified: Secondary | ICD-10-CM

## 2023-09-04 MED ORDER — DOXYCYCLINE HYCLATE 20 MG PO TABS
20.0000 mg | ORAL_TABLET | Freq: Two times a day (BID) | ORAL | 0 refills | Status: DC
Start: 2023-09-04 — End: 2023-11-19

## 2023-09-04 NOTE — Progress Notes (Signed)
Patient came into office and requested Doxycycline RF to [facility]. aw

## 2023-09-05 LAB — SURGICAL PATHOLOGY

## 2023-09-24 ENCOUNTER — Ambulatory Visit
Admission: RE | Admit: 2023-09-24 | Discharge: 2023-09-24 | Disposition: A | Discharge status: Home / Self Care | Payer: Medicare HMO | Source: Ambulatory Visit | Attending: Internal Medicine | Admitting: Internal Medicine

## 2023-09-24 DIAGNOSIS — Z1231 Encounter for screening mammogram for malignant neoplasm of breast: Secondary | ICD-10-CM | POA: Diagnosis present

## 2023-09-26 NOTE — Progress Notes (Unsigned)
PCP: Marguarite Arbour, MD   No chief complaint on file.   HPI:      Kayla Jensen is a 72 y.o. G1P1001 whose LMP was No LMP recorded. Patient is postmenopausal., presents today for her annual examination.  Her menses are absent due to menopause. She {does:18564} have vasomotor sx.   Sex activity: {sex active: 315163}. She {does:18564} have vaginal dryness.  Last Pap: 09/09/18 Results were: no abnormalities  Hx of STDs: {STD hx:14358}  Last mammogram: 09/24/23 Results were: normal--routine follow-up in 12 months There is no FH of breast cancer. There is no FH of ovarian cancer. The patient {does:18564} do self-breast exams.  Colonoscopy: 2017 at [facility]; Repeat due after 10*** years.   Tobacco use: {tob:20664} Alcohol use: {Alcohol:11675} No drug use Exercise: {exercise:31265}  She {does:18564} get adequate calcium and Vitamin D in her diet.  Labs with PCP.   Patient Active Problem List   Diagnosis Date Noted   Menopause 09/03/2017    Past Surgical History:  Procedure Laterality Date   ABDOMINAL HYSTERECTOMY     CARPAL TUNNEL RELEASE     COLONOSCOPY WITH PROPOFOL N/A 04/10/2016   Procedure: COLONOSCOPY WITH PROPOFOL;  Surgeon: Elnita Maxwell, MD;  Location: [facility] ENDOSCOPY;  Service: Endoscopy;  Laterality: N/A;   Left foot surgery     LEFT HEART CATH AND CORONARY ANGIOGRAPHY Left 10/07/2020   Procedure: LEFT HEART CATH AND CORONARY ANGIOGRAPHY;  Surgeon: Alwyn Pea, MD;  Location: [facility] INVASIVE CV LAB;  Service: Cardiovascular;  Laterality: Left;   right hand ganglion removal      Family History  Problem Relation Age of Onset   Breast cancer Maternal Aunt    Breast cancer Cousin     Social History   Socioeconomic History   Marital status: Widowed    Spouse name: Not on file   Number of children: Not on file   Years of education: Not on file   Highest education level: Not on file  Occupational History   Not on file  Tobacco Use   Smoking  status: Never   Smokeless tobacco: Never  Vaping Use   Vaping status: Never Used  Substance and Sexual Activity   Alcohol use: No   Drug use: No   Sexual activity: Yes  Other Topics Concern   Not on file  Social History Narrative   Not on file   Social Determinants of Health   Financial Resource Strain: Not on file  Food Insecurity: Not on file  Transportation Needs: Not on file  Physical Activity: Not on file  Stress: Not on file  Social Connections: Not on file  Intimate Partner Violence: Not on file     Current Outpatient Medications:    Biotin 09811 MCG TABS, Take 10,000 mcg by mouth daily., Disp: , Rfl:    Calcium Citrate-Vitamin D (CALCIUM + D PO), Take 1 tablet by mouth daily., Disp: , Rfl:    Calcium-Vitamin D-Vitamin K (VIACTIV CALCIUM PLUS D PO), Take 1 tablet by mouth daily., Disp: , Rfl:    cetirizine (ZYRTEC) 10 MG tablet, Take 10 mg by mouth daily as needed for allergies. , Disp: , Rfl:    cholecalciferol (VITAMIN D3) 25 MCG (1000 UNIT) tablet, Take 1,000 Units by mouth daily., Disp: , Rfl:    doxycycline (PERIOSTAT) 20 MG tablet, Take 1 tablet (20 mg total) by mouth 2 (two) times daily. Take with food and drink, Disp: 180 tablet, Rfl: 0   estrogens,  conjugated, (PREMARIN) 0.625 MG tablet, Take 1 tablet (0.625 mg total) by mouth daily. Take daily for 21 days then do not take for 7 days., Disp: 90 tablet, Rfl: 3   levothyroxine (SYNTHROID) 50 MCG tablet, Take 50 mcg by mouth daily before breakfast. , Disp: , Rfl:    magnesium oxide (MAG-OX) 400 MG tablet, Take 400 mg by mouth 2 (two) times daily., Disp: , Rfl:    meloxicam (MOBIC) 15 MG tablet, Take 15 mg by mouth daily., Disp: , Rfl:    metoprolol succinate (TOPROL-XL) 25 MG 24 hr tablet, Take 25 mg by mouth daily. , Disp: , Rfl:    metroNIDAZOLE (METROGEL) 1 % gel, Apply topically daily. Qhs to face for rosacea, Disp: 45 g, Rfl: 11   mometasone (ELOCON) 0.1 % cream, Apply 1 application topically as directed.  Qd/bid aa itchy rash on abdomen until clear, Disp: 45 g, Rfl: 1   Multiple Vitamin (MULTIVITAMIN WITH MINERALS) TABS tablet, Take 1 tablet by mouth daily., Disp: , Rfl:    mupirocin ointment (BACTROBAN) 2 %, Apply 1 application topically daily. Qd to excision site, Disp: 22 g, Rfl: 1   niacin 500 MG tablet, Take 500 mg by mouth daily., Disp: , Rfl:    verapamil (CALAN-SR) 180 MG CR tablet, Take 180 mg by mouth daily., Disp: , Rfl:    vitamin E 400 UNIT capsule, Take 400 Units by mouth daily., Disp: , Rfl:      ROS:  Review of Systems BREAST: No symptoms    Objective: There were no vitals taken for this visit.   OBGyn Exam  Results: No results found for this or any previous visit (from the past 24 hour(s)).  Assessment/Plan:  No diagnosis found.   No orders of the defined types were placed in this encounter.           GYN counsel {counseling: 16159}    F/U  No follow-ups on file.  Cassie Henkels B. Tulip Meharg, PA-C 09/26/2023 3:03 PM

## 2023-09-27 ENCOUNTER — Encounter: Payer: Self-pay | Admitting: Obstetrics and Gynecology

## 2023-09-27 ENCOUNTER — Ambulatory Visit: Payer: TRICARE For Life (TFL) | Admitting: Obstetrics and Gynecology

## 2023-09-27 VITALS — BP 112/64 | Ht 62.5 in | Wt 131.0 lb

## 2023-09-27 DIAGNOSIS — Z124 Encounter for screening for malignant neoplasm of cervix: Secondary | ICD-10-CM

## 2023-09-27 DIAGNOSIS — Z01419 Encounter for gynecological examination (general) (routine) without abnormal findings: Secondary | ICD-10-CM

## 2023-09-27 DIAGNOSIS — Z1231 Encounter for screening mammogram for malignant neoplasm of breast: Secondary | ICD-10-CM

## 2023-09-27 DIAGNOSIS — Z79890 Hormone replacement therapy (postmenopausal): Secondary | ICD-10-CM

## 2023-09-27 DIAGNOSIS — Z780 Asymptomatic menopausal state: Secondary | ICD-10-CM

## 2023-09-27 DIAGNOSIS — Z1211 Encounter for screening for malignant neoplasm of colon: Secondary | ICD-10-CM

## 2023-09-27 MED ORDER — ESTRADIOL 0.5 MG PO TABS
0.5000 mg | ORAL_TABLET | Freq: Every day | ORAL | 2 refills | Status: DC
Start: 2023-09-27 — End: 2023-11-16

## 2023-09-27 NOTE — Patient Instructions (Signed)
I value your feedback and you entrusting us with your care. If you get a [facility] patient survey, I would appreciate you taking the time to let us know about your experience today. Thank you! ? ? ?

## 2023-10-23 ENCOUNTER — Encounter: Payer: Self-pay | Admitting: Dermatology

## 2023-10-23 ENCOUNTER — Ambulatory Visit (INDEPENDENT_AMBULATORY_CARE_PROVIDER_SITE_OTHER): Payer: Medicare HMO | Admitting: Dermatology

## 2023-10-23 DIAGNOSIS — L820 Inflamed seborrheic keratosis: Secondary | ICD-10-CM

## 2023-10-23 DIAGNOSIS — L905 Scar conditions and fibrosis of skin: Secondary | ICD-10-CM | POA: Diagnosis not present

## 2023-10-23 NOTE — Progress Notes (Signed)
   Follow-Up Visit   Subjective  Kayla Jensen is a 72 y.o. female who presents for the following: Spot at right upper chest. Dur: comes and goes. Was red and inflamed last week. Non tender. Rubbed and irritated by clothing.   Recheck biopsy site on back. Make sure area is healing normally. 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  The patient has spots, moles and lesions to be evaluated, some may be new or changing and the patient may have concern these could be cancer.    The following portions of the chart were reviewed this encounter and updated as appropriate: medications, allergies, medical history  Review of Systems:  No other skin or systemic complaints except as noted in HPI or Assessment and Plan.  Objective  Well appearing patient in no apparent distress; mood and affect are within normal limits.  A focused examination was performed of the following areas: Chest, back  Relevant physical exam findings are noted in the Assessment and Plan.  right upper chest x1 Erythematous keratotic or waxy stuck-on papule or plaque.    Assessment & Plan   Inflamed seborrheic keratosis right upper chest x1  Symptomatic, irritating, patient would like treated.  Destruction of lesion - right upper chest x1 Complexity: simple   Destruction method: cryotherapy   Informed consent: discussed and consent obtained   Timeout:  patient name, date of birth, surgical site, and procedure verified Lesion destroyed using liquid nitrogen: Yes   Region frozen until ice ball extended beyond lesion: Yes   Cryotherapy cycles:  2 (1 or 2) Outcome: patient tolerated procedure well with no complications   Post-procedure details: wound care instructions given   Additional details:  Prior to procedure, discussed risks of blister formation, small wound, skin dyspigmentation, or rare scar following cryotherapy. Recommend Vaseline ointment to treated areas while healing.    SCAR, secondary to bx of hemangioma,  well healed Exam: Dyspigmented smooth macule or patch. Benign-appearing.  Observation.  Call clinic for new or changing lesions. Recommend daily broad spectrum sunscreen SPF 30+, reapply every 2 hours as needed. Treatment: Recommend Serica moisturizing scar formula cream every night or [facility] brand or Mederma silicone scar sheet every night for the first year after a scar appears to help with scar remodeling if desired. Scars remodel on their own for a full year and will gradually improve in appearance over time.     Return for TBSE As Scheduled.  I, Lawson Radar, CMA, am acting as scribe for Elie Goody, MD.   Documentation: I have reviewed the above documentation for accuracy and completeness, and I agree with the above.  Elie Goody, MD

## 2023-10-23 NOTE — Patient Instructions (Addendum)
Cryotherapy Aftercare  Wash gently with soap and water everyday.   Apply Vaseline Jelly and Band-Aid daily until healed.    DX: Irritated Seborrheic keratosis  Seborrheic Keratosis  What causes seborrheic keratoses? Seborrheic keratoses are harmless, common skin growths that first appear during adult life.  As time goes by, more growths appear.  Some people may develop a large number of them.  Seborrheic keratoses appear on both covered and uncovered body parts.  They are not caused by sunlight.  The tendency to develop seborrheic keratoses can be inherited.  They vary in color from skin-colored to gray, brown, or even black.  They can be either smooth or have a rough, warty surface.   Seborrheic keratoses are superficial and look as if they were stuck on the skin.  Under the microscope this type of keratosis looks like layers upon layers of skin.  That is why at times the top layer may seem to fall off, but the rest of the growth remains and re-grows.    Treatment Seborrheic keratoses do not need to be treated, but can easily be removed in the office.  Seborrheic keratoses often cause symptoms when they rub on clothing or jewelry.  Lesions can be in the way of shaving.  If they become inflamed, they can cause itching, soreness, or burning.  Removal of a seborrheic keratosis can be accomplished by freezing, burning, or surgery. If any spot bleeds, scabs, or grows rapidly, please return to have it checked, as these can be an indication of a skin cancer.   Due to recent changes in healthcare laws, you may see results of your pathology and/or laboratory studies on MyChart before the doctors have had a chance to review them. We understand that in some cases there may be results that are confusing or concerning to you. Please understand that not all results are received at the same time and often the doctors may need to interpret multiple results in order to provide you with the best plan of care or  course of treatment. Therefore, we ask that you please give [place] 2 business days to thoroughly review all your results before contacting the office for clarification. Should we see a critical lab result, you will be contacted sooner.   If You Need Anything After Your Visit  If you have any questions or concerns for your doctor, please call our main line at [phone] and press option 4 to reach your doctor's medical assistant. If no one answers, please leave a voicemail as directed and we will return your call as soon as possible. Messages left after 4 pm will be answered the following business day.   You may also send [place] a message via MyChart. We typically respond to MyChart messages within 1-2 business days.  For prescription refills, please ask your pharmacy to contact our office. Our fax number is [phone].  If you have an urgent issue when the clinic is closed that cannot wait until the next business day, you can page your doctor at the number below.    Please note that while we do our best to be available for urgent issues outside of office hours, we are not available 24/7.   If you have an urgent issue and are unable to reach [place], you may choose to seek medical care at your doctor's office, retail clinic, urgent care center, or emergency room.  If you have a medical emergency, please immediately call 911 or go to the emergency department.  Pager  Numbers  - Dr. Gwen Pounds: [phone]  - Dr. Roseanne Reno: [phone]  - Dr. Katrinka Blazing: [phone]   In the event of inclement weather, please call our main line at [phone] for an update on the status of any delays or closures.  Dermatology Medication Tips: Please keep the boxes that topical medications come in in order to help keep track of the instructions about where and how to use these. Pharmacies typically print the medication instructions only on the boxes and not directly on the medication tubes.   If your medication is too  expensive, please contact our office at [phone] option 4 or send [place] a message through MyChart.   We are unable to tell what your co-pay for medications will be in advance as this is different depending on your insurance coverage. However, we may be able to find a substitute medication at lower cost or fill out paperwork to get insurance to cover a needed medication.   If a prior authorization is required to get your medication covered by your insurance company, please allow [place] 1-2 business days to complete this process.  Drug prices often vary depending on where the prescription is filled and some pharmacies may offer cheaper prices.  The website www.goodrx.com contains coupons for medications through different pharmacies. The prices here do not account for what the cost may be with help from insurance (it may be cheaper with your insurance), but the website can give you the price if you did not use any insurance.  - You can print the associated coupon and take it with your prescription to the pharmacy.  - You may also stop by our office during regular business hours and pick up a GoodRx coupon card.  - If you need your prescription sent electronically to a different pharmacy, notify our office through [facility] or by phone at [phone] option 4.     Si Usted Necesita Algo Despus de Su Visita  Tambin puede enviarnos un mensaje a travs de Clinical cytogeneticist. Por lo general respondemos a los mensajes de MyChart en el transcurso de 1 a 2 das hbiles.  Para renovar recetas, por favor pida a su farmacia que se ponga en contacto con nuestra oficina. Annie Sable de fax es [place] [phone].  Si tiene un asunto urgente cuando la clnica est cerrada y que no puede esperar hasta el siguiente da hbil, puede llamar/localizar a su doctor(a) al nmero que aparece a continuacin.   Por favor, tenga en cuenta que aunque hacemos todo lo posible para estar disponibles para asuntos urgentes fuera  del horario de [place], no estamos disponibles las 24 horas del da, los 7 [address]  [address] de la [place].   Si tiene un problema urgente y no puede comunicarse con nosotros, puede optar por buscar atencin mdica  en el consultorio de su doctor(a), en una clnica privada, en un centro de atencin urgente o en una sala de emergencias.  Si tiene Engineer, drilling, por favor llame inmediatamente al 911 o vaya a la sala de emergencias.  Nmeros de bper  - Dr. Gwen Pounds: [phone]  - Dra. Roseanne Reno: [phone]  - Dr. Katrinka Blazing: [phone]   En caso de inclemencias del tiempo, por favor llame a Lacy Duverney principal al [phone] para una actualizacin sobre el [place] de cualquier retraso o cierre.  Consejos para la medicacin en dermatologa: Por favor, guarde las cajas en las que vienen los medicamentos de uso tpico para ayudarle a seguir las instrucciones sobre dnde y cmo usarlos. Las farmacias generalmente  imprimen las instrucciones del medicamento slo en las cajas y no directamente en los tubos del [place].   Si su medicamento es muy caro, por favor, pngase en contacto con Rolm Gala llamando al [phone] y presione la opcin 4 o envenos un mensaje a travs de Clinical cytogeneticist.   No podemos decirle cul ser su copago por los medicamentos por adelantado ya que esto es diferente dependiendo de la cobertura de su seguro. Sin embargo, es posible que podamos encontrar un medicamento sustituto a Audiological scientist un formulario para que el seguro cubra el medicamento que se considera necesario.   Si se requiere una autorizacin previa para que su compaa de seguros [place] su medicamento, por favor permtanos de 1 a 2 das hbiles para completar [address]  Los precios de los medicamentos varan con frecuencia dependiendo del Environmental consultant de dnde se surte la receta y alguna farmacias pueden ofrecer precios ms baratos.  El sitio web www.goodrx.com tiene cupones para medicamentos de  Health and safety inspector. Los precios aqu no tienen en cuenta lo que podra costar con la ayuda del seguro (puede ser ms barato con su seguro), pero el sitio web puede darle el precio si no utiliz Tourist information centre manager.  - Puede imprimir el cupn correspondiente y llevarlo con su receta a la farmacia.  - Tambin puede pasar por nuestra oficina durante el horario de atencin regular y Education officer, museum una tarjeta de cupones de GoodRx.  - Si necesita que su receta se enve electrnicamente a una farmacia diferente, informe a nuestra oficina a travs de MyChart de [place] o por telfono llamando al [phone] y presione la opcin 4.

## 2023-11-16 ENCOUNTER — Other Ambulatory Visit: Payer: Self-pay

## 2023-11-16 DIAGNOSIS — Z79890 Hormone replacement therapy (postmenopausal): Secondary | ICD-10-CM

## 2023-11-16 DIAGNOSIS — Z780 Asymptomatic menopausal state: Secondary | ICD-10-CM

## 2023-11-16 MED ORDER — ESTRADIOL 0.5 MG PO TABS
0.5000 mg | ORAL_TABLET | Freq: Every day | ORAL | 2 refills | Status: DC
Start: 2023-11-16 — End: 2024-08-04

## 2023-11-16 NOTE — Telephone Encounter (Signed)
Refill due to patient stating pharmacy did not receive the original prescription.

## 2023-11-19 ENCOUNTER — Other Ambulatory Visit: Payer: Self-pay

## 2023-11-19 DIAGNOSIS — L719 Rosacea, unspecified: Secondary | ICD-10-CM

## 2023-11-19 MED ORDER — DOXYCYCLINE HYCLATE 20 MG PO TABS: ORAL_TABLET | ORAL | 3 refills | Status: DC

## 2023-11-19 NOTE — Progress Notes (Signed)
Fax received with refill request.

## 2024-01-01 DIAGNOSIS — G5602 Carpal tunnel syndrome, left upper limb: Secondary | ICD-10-CM | POA: Diagnosis not present

## 2024-01-01 DIAGNOSIS — N1831 Chronic kidney disease, stage 3a: Secondary | ICD-10-CM | POA: Diagnosis not present

## 2024-01-03 DIAGNOSIS — R202 Paresthesia of skin: Secondary | ICD-10-CM | POA: Diagnosis not present

## 2024-01-15 DIAGNOSIS — R202 Paresthesia of skin: Secondary | ICD-10-CM | POA: Diagnosis not present

## 2024-01-30 DIAGNOSIS — R7303 Prediabetes: Secondary | ICD-10-CM | POA: Diagnosis not present

## 2024-01-30 DIAGNOSIS — E039 Hypothyroidism, unspecified: Secondary | ICD-10-CM | POA: Diagnosis not present

## 2024-01-30 DIAGNOSIS — Z0000 Encounter for general adult medical examination without abnormal findings: Secondary | ICD-10-CM | POA: Diagnosis not present

## 2024-01-30 DIAGNOSIS — T466X5A Adverse effect of antihyperlipidemic and antiarteriosclerotic drugs, initial encounter: Secondary | ICD-10-CM | POA: Diagnosis not present

## 2024-01-30 DIAGNOSIS — E782 Mixed hyperlipidemia: Secondary | ICD-10-CM | POA: Diagnosis not present

## 2024-01-30 DIAGNOSIS — G720 Drug-induced myopathy: Secondary | ICD-10-CM | POA: Diagnosis not present

## 2024-01-30 DIAGNOSIS — I471 Supraventricular tachycardia, unspecified: Secondary | ICD-10-CM | POA: Diagnosis not present

## 2024-03-11 DIAGNOSIS — R0789 Other chest pain: Secondary | ICD-10-CM | POA: Diagnosis not present

## 2024-03-11 DIAGNOSIS — G4700 Insomnia, unspecified: Secondary | ICD-10-CM | POA: Diagnosis not present

## 2024-03-11 DIAGNOSIS — F4321 Adjustment disorder with depressed mood: Secondary | ICD-10-CM | POA: Diagnosis not present

## 2024-03-11 DIAGNOSIS — I471 Supraventricular tachycardia, unspecified: Secondary | ICD-10-CM | POA: Diagnosis not present

## 2024-03-11 DIAGNOSIS — F411 Generalized anxiety disorder: Secondary | ICD-10-CM | POA: Diagnosis not present

## 2024-05-22 ENCOUNTER — Ambulatory Visit: Payer: Medicare HMO | Admitting: Dermatology

## 2024-05-29 DIAGNOSIS — E782 Mixed hyperlipidemia: Secondary | ICD-10-CM | POA: Diagnosis not present

## 2024-05-29 DIAGNOSIS — E039 Hypothyroidism, unspecified: Secondary | ICD-10-CM | POA: Diagnosis not present

## 2024-05-29 DIAGNOSIS — Z1331 Encounter for screening for depression: Secondary | ICD-10-CM | POA: Diagnosis not present

## 2024-05-29 DIAGNOSIS — I471 Supraventricular tachycardia, unspecified: Secondary | ICD-10-CM | POA: Diagnosis not present

## 2024-05-29 DIAGNOSIS — T466X5A Adverse effect of antihyperlipidemic and antiarteriosclerotic drugs, initial encounter: Secondary | ICD-10-CM | POA: Diagnosis not present

## 2024-05-29 DIAGNOSIS — G720 Drug-induced myopathy: Secondary | ICD-10-CM | POA: Diagnosis not present

## 2024-05-29 DIAGNOSIS — Z0000 Encounter for general adult medical examination without abnormal findings: Secondary | ICD-10-CM | POA: Diagnosis not present

## 2024-05-29 DIAGNOSIS — Z79899 Other long term (current) drug therapy: Secondary | ICD-10-CM | POA: Diagnosis not present

## 2024-05-29 DIAGNOSIS — R7303 Prediabetes: Secondary | ICD-10-CM | POA: Diagnosis not present

## 2024-05-29 DIAGNOSIS — Z1231 Encounter for screening mammogram for malignant neoplasm of breast: Secondary | ICD-10-CM | POA: Diagnosis not present

## 2024-06-02 ENCOUNTER — Other Ambulatory Visit: Payer: Self-pay | Admitting: Internal Medicine

## 2024-06-02 DIAGNOSIS — Z1231 Encounter for screening mammogram for malignant neoplasm of breast: Secondary | ICD-10-CM

## 2024-06-04 DIAGNOSIS — R7303 Prediabetes: Secondary | ICD-10-CM | POA: Diagnosis not present

## 2024-06-04 DIAGNOSIS — Z79899 Other long term (current) drug therapy: Secondary | ICD-10-CM | POA: Diagnosis not present

## 2024-06-04 DIAGNOSIS — E782 Mixed hyperlipidemia: Secondary | ICD-10-CM | POA: Diagnosis not present

## 2024-06-04 DIAGNOSIS — E039 Hypothyroidism, unspecified: Secondary | ICD-10-CM | POA: Diagnosis not present

## 2024-06-04 DIAGNOSIS — M79671 Pain in right foot: Secondary | ICD-10-CM | POA: Diagnosis not present

## 2024-06-04 DIAGNOSIS — R8290 Unspecified abnormal findings in urine: Secondary | ICD-10-CM | POA: Diagnosis not present

## 2024-06-30 ENCOUNTER — Ambulatory Visit (INDEPENDENT_AMBULATORY_CARE_PROVIDER_SITE_OTHER): Admitting: Dermatology

## 2024-06-30 ENCOUNTER — Encounter: Payer: Self-pay | Admitting: Dermatology

## 2024-06-30 DIAGNOSIS — Z7189 Other specified counseling: Secondary | ICD-10-CM

## 2024-06-30 DIAGNOSIS — L820 Inflamed seborrheic keratosis: Secondary | ICD-10-CM | POA: Diagnosis not present

## 2024-06-30 DIAGNOSIS — D229 Melanocytic nevi, unspecified: Secondary | ICD-10-CM

## 2024-06-30 DIAGNOSIS — L814 Other melanin hyperpigmentation: Secondary | ICD-10-CM | POA: Diagnosis not present

## 2024-06-30 DIAGNOSIS — L821 Other seborrheic keratosis: Secondary | ICD-10-CM | POA: Diagnosis not present

## 2024-06-30 DIAGNOSIS — W908XXA Exposure to other nonionizing radiation, initial encounter: Secondary | ICD-10-CM

## 2024-06-30 DIAGNOSIS — L237 Allergic contact dermatitis due to plants, except food: Secondary | ICD-10-CM | POA: Diagnosis not present

## 2024-06-30 DIAGNOSIS — Z86018 Personal history of other benign neoplasm: Secondary | ICD-10-CM

## 2024-06-30 DIAGNOSIS — D1801 Hemangioma of skin and subcutaneous tissue: Secondary | ICD-10-CM | POA: Diagnosis not present

## 2024-06-30 DIAGNOSIS — Z1283 Encounter for screening for malignant neoplasm of skin: Secondary | ICD-10-CM | POA: Diagnosis not present

## 2024-06-30 DIAGNOSIS — L578 Other skin changes due to chronic exposure to nonionizing radiation: Secondary | ICD-10-CM

## 2024-06-30 DIAGNOSIS — Z79899 Other long term (current) drug therapy: Secondary | ICD-10-CM

## 2024-06-30 MED ORDER — MOMETASONE FUROATE 0.1 % EX CREA: TOPICAL_CREAM | CUTANEOUS | 1 refills | Status: AC

## 2024-06-30 NOTE — Progress Notes (Signed)
   Follow-Up Visit   Subjective  Kayla Jensen is a 73 y.o. female who presents for the following: Skin Cancer Screening and Full Body Skin Exam, hx of Dysplastic nevus   The patient presents for Total-Body Skin Exam (TBSE) for skin cancer screening and mole check. The patient has spots, moles and lesions to be evaluated, some may be new or changing and the patient may have concern these could be cancer.  The following portions of the chart were reviewed this encounter and updated as appropriate: medications, allergies, medical history  Review of Systems:  No other skin or systemic complaints except as noted in HPI or Assessment and Plan.  Objective  Well appearing patient in no apparent distress; mood and affect are within normal limits.  A full examination was performed including scalp, head, eyes, ears, nose, lips, neck, chest, axillae, abdomen, back, buttocks, bilateral upper extremities, bilateral lower extremities, hands, feet, fingers, toes, fingernails, and toenails. All findings within normal limits unless otherwise noted below.   Relevant physical exam findings are noted in the Assessment and Plan.  right post flank, right chest, left post thigh (5) Stuck-on, waxy, tan-brown papules and plaques -- Discussed benign etiology and prognosis.   Assessment & Plan   SKIN CANCER SCREENING PERFORMED TODAY.  ACTINIC DAMAGE - Chronic condition, secondary to cumulative UV/sun exposure - diffuse scaly erythematous macules with underlying dyspigmentation - Recommend daily broad spectrum sunscreen SPF 30+ to sun-exposed areas, reapply every 2 hours as needed.  - Staying in the shade or wearing long sleeves, sun glasses (UVA+UVB protection) and wide brim hats (4-inch brim around the entire circumference of the hat) are also recommended for sun protection.  - Call for new or changing lesions.  LENTIGINES, SEBORRHEIC KERATOSES, HEMANGIOMAS - Benign normal skin lesions - Benign-appearing -  Call for any changes  MELANOCYTIC NEVI - Tan-brown and/or pink-flesh-colored symmetric macules and papules - Benign appearing on exam today - Observation - Call clinic for new or changing moles - Recommend daily use of broad spectrum spf 30+ sunscreen to sun-exposed areas.   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  Call if any new or changing lesions are noted between office visits   ALLERGIC CONTACT DERMATITIS-plant Poison ivy  Exam: scaly pink papules and/or plaques +/- vesiculation on her forehead Treatment Plan: Start Mometasone  cream apply to forehead qd-bid prn    INFLAMED SEBORRHEIC KERATOSIS (5) right post flank, right chest, left post thigh (5) Symptomatic, irritating, patient would like treated.  Destruction of lesion - right post flank, right chest, left post thigh (5) Complexity: simple   Destruction method: cryotherapy   Informed consent: discussed and consent obtained   Timeout:  patient name, date of birth, surgical site, and procedure verified Lesion destroyed using liquid nitrogen: Yes   Region frozen until ice ball extended beyond lesion: Yes   Outcome: patient tolerated procedure well with no complications   Post-procedure details: wound care instructions given      Return in about 1 year (around 06/30/2025) for TBSE, hx of Dysplastic nevus .  IFay Kirks, CMA, am acting as scribe for Alm Rhyme, MD .   Documentation: I have reviewed the above documentation for accuracy and completeness, and I agree with the above.  Alm Rhyme, MD

## 2024-06-30 NOTE — Patient Instructions (Addendum)

## 2024-07-09 DIAGNOSIS — Z79899 Other long term (current) drug therapy: Secondary | ICD-10-CM | POA: Diagnosis not present

## 2024-08-04 ENCOUNTER — Other Ambulatory Visit: Payer: Self-pay | Admitting: Obstetrics and Gynecology

## 2024-08-04 DIAGNOSIS — Z79890 Hormone replacement therapy (postmenopausal): Secondary | ICD-10-CM

## 2024-08-04 DIAGNOSIS — Z780 Asymptomatic menopausal state: Secondary | ICD-10-CM

## 2024-09-28 NOTE — Progress Notes (Unsigned)
 PCP: Auston Reyes BIRCH, MD   No chief complaint on file.   HPI:      [name] is a 73 y.o. G1P1001 whose LMP was No LMP recorded. Patient is postmenopausal., presents today for her annual examination.  Her menses are absent due to [facility] age 55 for pelvic pain/pelvic infection. No PMB. Has been on ERT since then; currently on estradiol  0.625 mg 21 days/7 off (don't know why). No VS sx on ERT or on 7 days off. Trying to wean off last yr  Sex activity: not sexually active. She does not have vaginal dryness/sx.  Last Pap: 09/09/18 Results were: no abnormalities ; s/p hyst; no hx of abn paps.   Last mammogram: 09/24/23 Results were: normal--routine follow-up in 12 months; has appt 10/25 There is a FH of breast cancer in her mat aunt and cousin; genetic testing not indicated for pt. There is no FH of ovarian cancer. The patient does do self-breast exams.  Colonoscopy: 2017 at [facility]; Repeat due after 10 years.  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  Tobacco use: The patient denies current or previous tobacco use. Alcohol use: social drinker No drug use Exercise: moderately active  She does get adequate calcium and Vitamin D in her diet.  Labs with PCP.   Patient Active Problem List   Diagnosis Date Noted   Menopause 09/03/2017    Past Surgical History:  Procedure Laterality Date   ABDOMINAL HYSTERECTOMY     CARPAL TUNNEL RELEASE     COLONOSCOPY WITH PROPOFOL  N/A 04/10/2016   Procedure: COLONOSCOPY WITH PROPOFOL ;  Surgeon: Donnice Vaughn Manes, MD;  Location: [facility] ENDOSCOPY;  Service: Endoscopy;  Laterality: N/A;   Left foot surgery     LEFT HEART CATH AND CORONARY ANGIOGRAPHY Left 10/07/2020   Procedure: LEFT HEART CATH AND CORONARY ANGIOGRAPHY;  Surgeon: Florencio Cara BIRCH, MD;  Location: [facility] INVASIVE CV LAB;  Service: Cardiovascular;  Laterality: Left;   OTHER SURGICAL HISTORY  02/2023   Thumb on Left Hand   right hand ganglion removal      Family  History  Problem Relation Age of Onset   Breast cancer Maternal Aunt        unknown age   Breast cancer Cousin        unsure of age    Social History   Socioeconomic History   Marital status: Widowed    Spouse name: Not on file   Number of children: Not on file   Years of education: Not on file   Highest education level: Not on file  Occupational History   Not on file  Tobacco Use   Smoking status: Never   Smokeless tobacco: Never  Vaping Use   Vaping status: Never Used  Substance and Sexual Activity   Alcohol use: No   Drug use: No   Sexual activity: Not Currently    Birth control/protection: Post-menopausal  Other Topics Concern   Not on file  Social History Narrative   Not on file   Social Drivers of Health   Financial Resource Strain: Low Risk  (05/28/2024)   Received from [facility]   Overall Financial Resource Strain (CARDIA)    Difficulty of Paying Living Expenses: Not hard at all  Food Insecurity: No Food Insecurity (05/28/2024)   Received from [facility]   Hunger Vital Sign    Within the past 12 months, you worried that your food would run out  before you got the money to buy more.: Never true    Within the past 12 months, the food you bought just didn't last and you didn't have money to get more.: Never true  Transportation Needs: No Transportation Needs (05/28/2024)   Received from [facility] - Transportation    In the past 12 months, has lack of transportation kept you from medical appointments or from getting medications?: No    Lack of Transportation (Non-Medical): No  Physical Activity: Not on file  Stress: Not on file  Social Connections: Not on file  Intimate Partner Violence: Not on file     Current Outpatient Medications:    Calcium Citrate-Vitamin D (CALCIUM + D PO), Take 1 tablet by mouth daily., Disp: , Rfl:    Calcium-Vitamin D-Vitamin K (VIACTIV CALCIUM PLUS D PO), Take 1  tablet by mouth daily., Disp: , Rfl:    cetirizine (ZYRTEC) 10 MG tablet, Take 10 mg by mouth daily as needed for allergies. , Disp: , Rfl:    cholecalciferol (VITAMIN D3) 25 MCG (1000 UNIT) tablet, Take 1,000 Units by mouth daily., Disp: , Rfl:    doxycycline  (PERIOSTAT ) 20 MG tablet, Take one tab po BID. Take with food and drink, Disp: 180 tablet, Rfl: 3   estradiol  (ESTRACE ) 0.5 MG tablet, TAKE 1 TABLET DAILY, Disp: 90 tablet, Rfl: 0   ezetimibe (ZETIA) 10 MG tablet, Take 10 mg by mouth daily., Disp: , Rfl:    ibuprofen (ADVIL) 600 MG tablet, Take 1 tablet 3 times a day by oral route., Disp: , Rfl:    levothyroxine (SYNTHROID) 50 MCG tablet, Take 50 mcg by mouth daily before breakfast. , Disp: , Rfl:    magnesium oxide (MAG-OX) 400 MG tablet, Take 400 mg by mouth 2 (two) times daily., Disp: , Rfl:    metoprolol succinate (TOPROL-XL) 25 MG 24 hr tablet, Take 25 mg by mouth daily. , Disp: , Rfl:    metoprolol tartrate (LOPRESSOR) 25 MG tablet, Take by mouth., Disp: , Rfl:    metroNIDAZOLE  (METROGEL ) 1 % gel, Apply topically daily. Qhs to face for rosacea, Disp: 45 g, Rfl: 11   mometasone  (ELOCON ) 0.1 % cream, Apply to affected skin qd-bid prn, Disp: 15 g, Rfl: 1   Multiple Vitamin (MULTIVITAMIN WITH MINERALS) TABS tablet, Take 1 tablet by mouth daily., Disp: , Rfl:    niacin 500 MG tablet, Take 500 mg by mouth daily., Disp: , Rfl:    Omega-3 1000 MG CAPS, Take by mouth., Disp: , Rfl:    verapamil  (CALAN -SR) 180 MG CR tablet, Take 180 mg by mouth daily., Disp: , Rfl:    vitamin E 400 UNIT capsule, Take 400 Units by mouth daily., Disp: , Rfl:      ROS:  Review of Systems  Constitutional:  Negative for fatigue, fever and unexpected weight change.  Respiratory:  Negative for cough, shortness of breath and wheezing.   Cardiovascular:  Negative for chest pain, palpitations and leg swelling.  Gastrointestinal:  Negative for blood in stool, constipation, diarrhea, nausea and vomiting.   Endocrine: Negative for cold intolerance, heat intolerance and polyuria.  Genitourinary:  Negative for dyspareunia, dysuria, flank pain, frequency, genital sores, hematuria, menstrual problem, pelvic pain, urgency, vaginal bleeding, vaginal discharge and vaginal pain.  Musculoskeletal:  Negative for back pain, joint swelling and myalgias.  Skin:  Negative for rash.  Neurological:  Negative for dizziness, syncope, light-headedness, numbness and headaches.  Hematological:  Negative for adenopathy.  Psychiatric/Behavioral:  Negative for agitation, confusion, sleep disturbance and suicidal ideas. The patient is not nervous/anxious.    BREAST: No symptoms    Objective: There were no vitals taken for this visit.   Physical Exam Constitutional:      Appearance: She is well-developed.  Genitourinary:     Vulva normal.     Genitourinary Comments: UTERUS/CX SURG REM     Right Labia: No rash, tenderness or lesions.    Left Labia: No tenderness, lesions or rash.    Vaginal cuff intact.    No vaginal discharge, erythema or tenderness.     Mild vaginal atrophy present.     Right Adnexa: not tender and no mass present.    Left Adnexa: not tender and no mass present.    Cervix is absent.     Uterus is absent.  Breasts:    Right: No mass, nipple discharge, skin change or tenderness.     Left: No mass, nipple discharge, skin change or tenderness.  Neck:     Thyroid : No thyromegaly.  Cardiovascular:     Rate and Rhythm: Normal rate and regular rhythm.     Heart sounds: Normal heart sounds. No murmur heard. Pulmonary:     Effort: Pulmonary effort is normal.     Breath sounds: Normal breath sounds.  Abdominal:     Palpations: Abdomen is soft.     Tenderness: There is no abdominal tenderness. There is no guarding.  Musculoskeletal:        General: Normal range of motion.     Cervical back: Normal range of motion.  Neurological:     General: No focal deficit present.     Mental Status:  She is alert and oriented to person, place, and time.     Cranial Nerves: No cranial nerve deficit.  Skin:    General: Skin is warm and dry.  Psychiatric:        Mood and Affect: Mood normal.        Behavior: Behavior normal.        Thought Content: Thought content normal.        Judgment: Judgment normal.  Vitals reviewed.    Assessment/Plan:  Encounter for annual routine gynecological examination  Encounter for screening mammogram for malignant neoplasm of breast; pt current on mammo  Hormone replacement therapy (HRT) - Plan: estradiol  (ESTRACE ) 0.5 MG tablet; discussed decreasing ERT dose. No VS sx on 7 days monthly without hormones. Decrease to 0.5 mg dose, Rx eRxd to [facility]. Can then eventually do 1/2 tab for 0.25 mg dose when she is ready.   Menopause - Plan: estradiol  (ESTRACE ) 0.5 MG tablet   No orders of the defined types were placed in this encounter.           GYN counsel breast self exam, mammography screening, menopause, adequate intake of calcium and vitamin D, diet and exercise    F/U  No follow-ups on file.  Lenorris Karger B. Etosha Wetherell, PA-C 09/28/2024 9:32 AM

## 2024-09-29 ENCOUNTER — Encounter: Payer: Self-pay | Admitting: Obstetrics and Gynecology

## 2024-09-29 ENCOUNTER — Encounter

## 2024-09-29 ENCOUNTER — Ambulatory Visit (INDEPENDENT_AMBULATORY_CARE_PROVIDER_SITE_OTHER): Admitting: Obstetrics and Gynecology

## 2024-09-29 VITALS — BP 112/66 | HR 72 | Ht 62.0 in | Wt 142.0 lb

## 2024-09-29 DIAGNOSIS — Z780 Asymptomatic menopausal state: Secondary | ICD-10-CM

## 2024-09-29 DIAGNOSIS — Z79890 Hormone replacement therapy (postmenopausal): Secondary | ICD-10-CM

## 2024-09-29 DIAGNOSIS — Z01419 Encounter for gynecological examination (general) (routine) without abnormal findings: Secondary | ICD-10-CM

## 2024-09-29 DIAGNOSIS — Z90710 Acquired absence of both cervix and uterus: Secondary | ICD-10-CM

## 2024-09-29 DIAGNOSIS — Z1231 Encounter for screening mammogram for malignant neoplasm of breast: Secondary | ICD-10-CM

## 2024-09-29 MED ORDER — ESTRADIOL 0.5 MG PO TABS: 0.2500 mg | ORAL_TABLET | Freq: Every day | ORAL | 0 refills | Status: AC

## 2024-09-29 NOTE — Patient Instructions (Signed)
 I value your feedback and you entrusting [place] with your care. If you get a [place] patient survey, I would appreciate you taking the time to let us know about your experience today. Thank you! ? ? ?

## 2024-09-30 ENCOUNTER — Ambulatory Visit
Admission: RE | Admit: 2024-09-30 | Discharge: 2024-09-30 | Disposition: A | Discharge status: Home / Self Care | Source: Ambulatory Visit | Attending: Internal Medicine | Admitting: Internal Medicine

## 2024-09-30 DIAGNOSIS — Z1231 Encounter for screening mammogram for malignant neoplasm of breast: Secondary | ICD-10-CM | POA: Diagnosis not present

## 2024-10-01 DIAGNOSIS — T466X5A Adverse effect of antihyperlipidemic and antiarteriosclerotic drugs, initial encounter: Secondary | ICD-10-CM | POA: Diagnosis not present

## 2024-10-01 DIAGNOSIS — E039 Hypothyroidism, unspecified: Secondary | ICD-10-CM | POA: Diagnosis not present

## 2024-10-01 DIAGNOSIS — N1831 Chronic kidney disease, stage 3a: Secondary | ICD-10-CM | POA: Diagnosis not present

## 2024-10-01 DIAGNOSIS — E782 Mixed hyperlipidemia: Secondary | ICD-10-CM | POA: Diagnosis not present

## 2024-10-01 DIAGNOSIS — G720 Drug-induced myopathy: Secondary | ICD-10-CM | POA: Diagnosis not present

## 2024-10-01 DIAGNOSIS — Z79899 Other long term (current) drug therapy: Secondary | ICD-10-CM | POA: Diagnosis not present

## 2024-10-01 DIAGNOSIS — R7303 Prediabetes: Secondary | ICD-10-CM | POA: Diagnosis not present

## 2024-10-01 DIAGNOSIS — I471 Supraventricular tachycardia, unspecified: Secondary | ICD-10-CM | POA: Diagnosis not present

## 2024-11-03 DIAGNOSIS — R202 Paresthesia of skin: Secondary | ICD-10-CM | POA: Diagnosis not present

## 2024-11-13 ENCOUNTER — Other Ambulatory Visit: Payer: Self-pay

## 2024-11-13 DIAGNOSIS — L719 Rosacea, unspecified: Secondary | ICD-10-CM

## 2024-11-13 MED ORDER — DOXYCYCLINE HYCLATE 20 MG PO TABS: ORAL_TABLET | ORAL | 1 refills | Status: AC

## 2024-12-30 DIAGNOSIS — Z79890 Hormone replacement therapy (postmenopausal): Secondary | ICD-10-CM

## 2024-12-30 DIAGNOSIS — Z780 Asymptomatic menopausal state: Secondary | ICD-10-CM

## 2025-07-02 ENCOUNTER — Ambulatory Visit: Admitting: Dermatology
